# Patient Record
Sex: Male | Born: 1955 | Smoking: Former smoker
Health system: Southern US, Community
[De-identification: ages and names within clinical notes are randomized; demographics above are authoritative.]

## PROBLEM LIST (undated history)

## (undated) DIAGNOSIS — Z789 Other specified health status: Secondary | ICD-10-CM

## (undated) DIAGNOSIS — G454 Transient global amnesia: Secondary | ICD-10-CM

## (undated) DIAGNOSIS — K429 Umbilical hernia without obstruction or gangrene: Secondary | ICD-10-CM

## (undated) DIAGNOSIS — K5790 Diverticulosis of intestine, part unspecified, without perforation or abscess without bleeding: Secondary | ICD-10-CM

## (undated) DIAGNOSIS — D126 Benign neoplasm of colon, unspecified: Secondary | ICD-10-CM

## (undated) DIAGNOSIS — I10 Essential (primary) hypertension: Secondary | ICD-10-CM

## (undated) DIAGNOSIS — R7303 Prediabetes: Secondary | ICD-10-CM

## (undated) DIAGNOSIS — E785 Hyperlipidemia, unspecified: Secondary | ICD-10-CM

## (undated) DIAGNOSIS — Z87891 Personal history of nicotine dependence: Secondary | ICD-10-CM

## (undated) HISTORY — PX: NO PAST SURGERIES: SHX2092

---

## 2015-05-26 ENCOUNTER — Encounter: Payer: Self-pay | Admitting: *Deleted

## 2015-05-29 ENCOUNTER — Ambulatory Visit: Payer: BLUE CROSS/BLUE SHIELD | Admitting: *Deleted

## 2015-05-29 ENCOUNTER — Encounter
Admission: RE | Disposition: A | Discharge status: Home / Self Care | Payer: Self-pay | Source: Ambulatory Visit | Attending: Gastroenterology

## 2015-05-29 ENCOUNTER — Ambulatory Visit
Admission: RE | Admit: 2015-05-29 | Discharge: 2015-05-29 | Disposition: A | Discharge status: Home / Self Care | Payer: BLUE CROSS/BLUE SHIELD | Source: Ambulatory Visit | Attending: Gastroenterology | Admitting: Gastroenterology

## 2015-05-29 ENCOUNTER — Encounter: Payer: Self-pay | Admitting: *Deleted

## 2015-05-29 DIAGNOSIS — Z1211 Encounter for screening for malignant neoplasm of colon: Secondary | ICD-10-CM | POA: Diagnosis present

## 2015-05-29 DIAGNOSIS — D120 Benign neoplasm of cecum: Secondary | ICD-10-CM | POA: Diagnosis not present

## 2015-05-29 DIAGNOSIS — K5730 Diverticulosis of large intestine without perforation or abscess without bleeding: Secondary | ICD-10-CM | POA: Insufficient documentation

## 2015-05-29 HISTORY — PX: COLONOSCOPY WITH PROPOFOL: SHX5780

## 2015-05-29 HISTORY — DX: Other specified health status: Z78.9

## 2015-05-29 SURGERY — COLONOSCOPY WITH PROPOFOL
Anesthesia: General

## 2015-05-29 MED ORDER — PROPOFOL 500 MG/50ML IV EMUL
INTRAVENOUS | Status: DC | PRN
  Administered 2015-05-29: 140 ug/kg/min via INTRAVENOUS

## 2015-05-29 MED ORDER — FENTANYL CITRATE (PF) 100 MCG/2ML IJ SOLN
INTRAMUSCULAR | Status: DC | PRN
  Administered 2015-05-29: 100 ug via INTRAVENOUS

## 2015-05-29 MED ORDER — PROPOFOL 10 MG/ML IV BOLUS
INTRAVENOUS | Status: DC | PRN
  Administered 2015-05-29: 40 mg via INTRAVENOUS

## 2015-05-29 MED ORDER — MIDAZOLAM HCL 5 MG/5ML IJ SOLN
INTRAMUSCULAR | Status: DC | PRN
  Administered 2015-05-29 (×2): 1 mg via INTRAVENOUS

## 2015-05-29 MED ORDER — SODIUM CHLORIDE 0.9 % IV SOLN: INTRAVENOUS | Status: DC

## 2015-05-29 MED ORDER — SODIUM CHLORIDE 0.9 % IV SOLN
INTRAVENOUS | Status: DC
  Administered 2015-05-29: 14:00:00 via INTRAVENOUS

## 2015-05-29 NOTE — Anesthesia Procedure Notes (Signed)
Date/Time: 05/29/2015 3:03 PM Performed by: Kennon Holter Pre-anesthesia Checklist: Timeout performed, Patient being monitored, Suction available, Emergency Drugs available and Patient identified Patient Re-evaluated:Patient Re-evaluated prior to inductionOxygen Delivery Method: Nasal cannula Preoxygenation: Pre-oxygenation with 100% oxygen Intubation Type: IV induction Placement Confirmation: positive ETCO2

## 2015-05-29 NOTE — Transfer of Care (Signed)
Immediate Anesthesia Transfer of Care Note  Patient: Marco Carpenter  Procedure(s) Performed: Procedure(s): COLONOSCOPY WITH PROPOFOL (N/A)  Patient Location: PACU and Endoscopy Unit  Anesthesia Type:General  Level of Consciousness: awake, alert  and oriented  Airway & Oxygen Therapy: Patient Spontanous Breathing and Patient connected to nasal cannula oxygen  Post-op Assessment: Report given to RN and Post -op Vital signs reviewed and stable  Post vital signs: Reviewed and stable  Last Vitals:  Filed Vitals:   05/29/15 1419 05/29/15 1538  BP: 143/97   Pulse: 63   Temp: 36.6 C 35.7 C  Resp: 14     Last Pain: There were no vitals filed for this visit.       Complications: No apparent anesthesia complications

## 2015-05-29 NOTE — Anesthesia Preprocedure Evaluation (Addendum)
Anesthesia Evaluation  Patient identified by MRN, date of birth, ID band Patient awake    Reviewed: Allergy & Precautions, NPO status , Patient's Chart, lab work & pertinent test results  Airway Mallampati: II       Dental  (+) Teeth Intact   Pulmonary former smoker,           Cardiovascular Exercise Tolerance: Good negative cardio ROS   Rhythm:Regular     Neuro/Psych    GI/Hepatic negative GI ROS, Neg liver ROS,   Endo/Other  negative endocrine ROS  Renal/GU negative Renal ROS  negative genitourinary   Musculoskeletal   Abdominal   Peds negative pediatric ROS (+)  Hematology negative hematology ROS (+)   Anesthesia Other Findings   Reproductive/Obstetrics                            Anesthesia Physical Anesthesia Plan  ASA: II  Anesthesia Plan: General   Post-op Pain Management:    Induction: Intravenous  Airway Management Planned: Natural Airway  Additional Equipment:   Intra-op Plan:   Post-operative Plan:   Informed Consent: I have reviewed the patients History and Physical, chart, labs and discussed the procedure including the risks, benefits and alternatives for the proposed anesthesia with the patient or authorized representative who has indicated his/her understanding and acceptance.     Plan Discussed with: CRNA  Anesthesia Plan Comments:         Anesthesia Quick Evaluation

## 2015-05-29 NOTE — H&P (Signed)
Outpatient short stay form Pre-procedure 05/29/2015 2:43 PM Lollie Sails MD  Primary Physician: Dr Dion Body  Reason for visit:  Screening colonoscopy  History of present illness:  Patient is a 60 year old male presenting today as above. Tolerated his prep well. He takes no aspirin or blood thinning products.    Current facility-administered medications:  .  0.9 %  sodium chloride infusion, , Intravenous, Continuous, Lollie Sails, MD, Last Rate: 20 mL/hr at 05/29/15 1428 .  0.9 %  sodium chloride infusion, , Intravenous, Continuous, Lollie Sails, MD  No prescriptions prior to admission     No Known Allergies   Past Medical History  Diagnosis Date  . Medical history non-contributory     Review of systems:      Physical Exam    Heart and lungs: Regular rate and rhythm without rub or gallop, lungs are bilaterally clear.    HEENT: Normocephalic atraumatic eyes are anicteric    Other:     Pertinant exam for procedure: Soft nontender nondistended bowel sounds positive normoactive.    Planned proceedures: Colonoscopy and indicated procedures. I have discussed the risks benefits and complications of procedures to include not limited to bleeding, infection, perforation and the risk of sedation and the patient wishes to proceed.    Lollie Sails, MD Gastroenterology 05/29/2015  2:43 PM

## 2015-05-29 NOTE — Op Note (Signed)
[facility] Gastroenterology Patient Name: Marco Carpenter Procedure Date: 05/29/2015 2:59 PM MRN: [id number] Account #: [id number] Date of Birth: [birth year] Admit Type: Outpatient Age: 60 Room: [facility] ENDO ROOM 3 Gender: Male Note Status: Finalized Procedure:            Colonoscopy Indications:          Screening for colorectal malignant neoplasm Providers:            Lollie Sails, MD Referring MD:         Dion Body (Referring MD) Medicines:            Monitored Anesthesia Care Complications:        No immediate complications. Procedure:            Pre-Anesthesia Assessment:                       - ASA Grade Assessment: II - A patient with mild                        systemic disease.                       After obtaining informed consent, the colonoscope was                        passed under direct vision. Throughout the procedure,                        the patient's blood pressure, pulse, and oxygen                        saturations were monitored continuously. The                        Colonoscope was introduced through the anus and                        advanced to the the cecum, identified by appendiceal                        orifice and ileocecal valve. The colonoscopy was                        performed without difficulty. The colonoscopy was                        performed with moderate difficulty due to significant                        looping and a tortuous colon. Successful completion of                        the procedure was aided by changing the patient to a                        supine position and using manual pressure. The patient                        tolerated the procedure well. The quality of the bowel  preparation was fair. Findings:      A few small-mouthed diverticula were found in the sigmoid colon and       descending colon.      A 2 mm polyp was found in the cecum. The polyp was sessile. The  polyp       was removed with a cold biopsy forceps. Resection and retrieval were       complete.      The digital rectal exam was normal. Impression:           - Diverticulosis in the sigmoid colon and in the                        descending colon.                       - One 2 mm polyp in the cecum, removed with a cold                        biopsy forceps. Resected and retrieved. Recommendation:       - Discharge patient to home.                       - Telephone GI clinic for pathology results in 1 week. Procedure Code(s):    --- Professional ---                       [phone], Colonoscopy, flexible; with biopsy, single or                        multiple Diagnosis Code(s):    --- Professional ---                       Z12.11, Encounter for screening for malignant neoplasm                        of colon                       D12.0, Benign neoplasm of cecum                       K57.30, Diverticulosis of large intestine without                        perforation or abscess without bleeding CPT copyright 2016 [employer]. All rights reserved. The codes documented in this report are preliminary and upon coder review may  be revised to meet current compliance requirements. Lollie Sails, MD 05/29/2015 3:35:44 PM This report has been signed electronically. Number of Addenda: 0 Note Initiated On: 05/29/2015 2:59 PM Scope Withdrawal Time: 0 hours 8 minutes 42 seconds  Total Procedure Duration: 0 hours 26 minutes 18 seconds       [facility]

## 2015-05-30 ENCOUNTER — Encounter: Payer: Self-pay | Admitting: Gastroenterology

## 2015-05-30 NOTE — Anesthesia Postprocedure Evaluation (Signed)
Anesthesia Post Note  Patient: Marco Carpenter  Procedure(s) Performed: Procedure(s) (LRB): COLONOSCOPY WITH PROPOFOL (N/A)  Patient location during evaluation: Endoscopy Anesthesia Type: General Level of consciousness: awake and alert Pain management: pain level controlled Vital Signs Assessment: post-procedure vital signs reviewed and stable Respiratory status: spontaneous breathing, nonlabored ventilation, respiratory function stable and patient connected to nasal cannula oxygen Cardiovascular status: blood pressure returned to baseline and stable Postop Assessment: no signs of nausea or vomiting Anesthetic complications: no    Last Vitals:  Filed Vitals:   05/29/15 1558 05/29/15 1608  BP: 106/81 121/83  Pulse: 57 51  Temp:    Resp: 16 17    Last Pain: There were no vitals filed for this visit.               Martha Clan

## 2015-05-31 LAB — SURGICAL PATHOLOGY

## 2020-01-29 HISTORY — PX: NASAL SINUS SURGERY: SHX719

## 2020-03-09 ENCOUNTER — Other Ambulatory Visit: Payer: Self-pay | Admitting: Family Medicine

## 2020-03-09 DIAGNOSIS — Z87828 Personal history of other (healed) physical injury and trauma: Secondary | ICD-10-CM

## 2020-03-29 ENCOUNTER — Ambulatory Visit
Admission: RE | Admit: 2020-03-29 | Discharge: 2020-03-29 | Disposition: A | Discharge status: Home / Self Care | Payer: Medicare Other | Source: Ambulatory Visit | Attending: Family Medicine | Admitting: Family Medicine

## 2020-03-29 ENCOUNTER — Other Ambulatory Visit: Payer: Self-pay

## 2020-03-29 DIAGNOSIS — Z87828 Personal history of other (healed) physical injury and trauma: Secondary | ICD-10-CM

## 2020-03-29 DIAGNOSIS — W19XXXA Unspecified fall, initial encounter: Secondary | ICD-10-CM | POA: Diagnosis not present

## 2020-03-29 DIAGNOSIS — R519 Headache, unspecified: Secondary | ICD-10-CM | POA: Insufficient documentation

## 2020-03-29 DIAGNOSIS — Z87820 Personal history of traumatic brain injury: Secondary | ICD-10-CM | POA: Insufficient documentation

## 2020-03-29 IMAGING — CT CT HEAD W/O CM
1 series · 15 of 30 positions shown, 19 images · non-contrast
Comparison: No pertinent prior exams available for comparison.

CLINICAL DATA: History of traumatic head injury. Additional history
provided: Fall [DATE] hitting head in left frontal area,
headache since that time.

EXAM:
CT HEAD WITHOUT CONTRAST
TECHNIQUE: Contiguous axial images were obtained from the base of the skull
through the vertex without intravenous contrast.

[Series 2: head wo · axial · 0.43mm/px · z∈[-116,+24]mm · 15 of 32 slices shown, 19 images]
[im 2/32  brain]
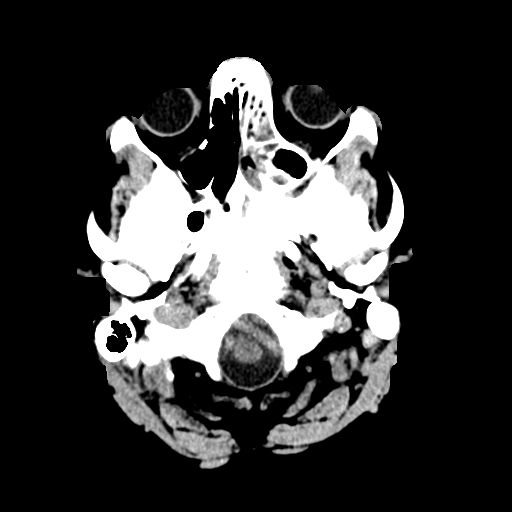
[im 2/32  bone]
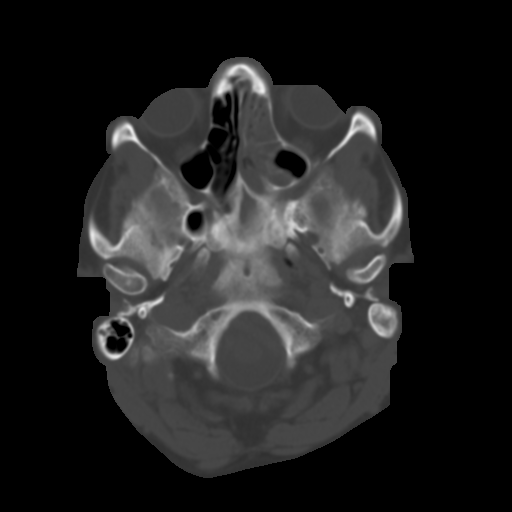
[im 4/32  brain]
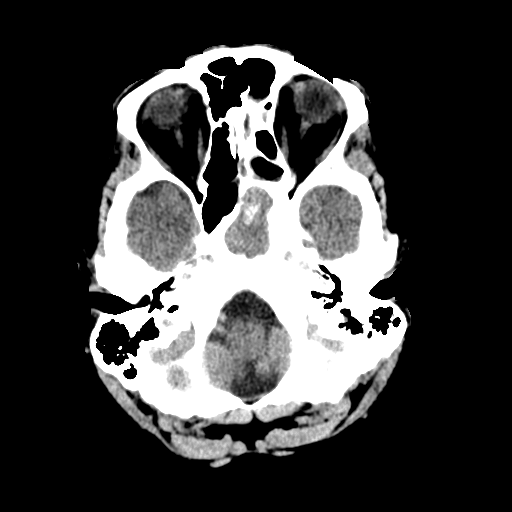
[im 6/32  brain]
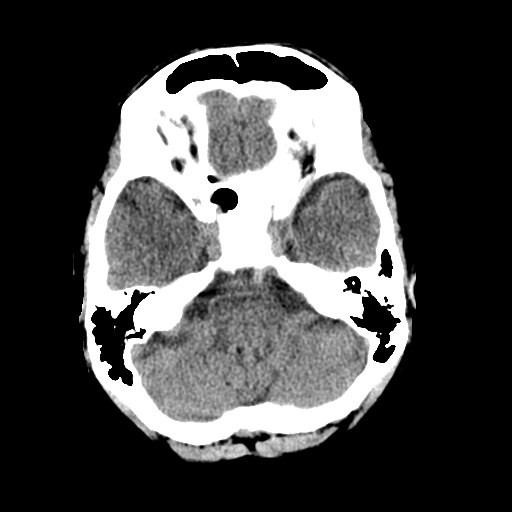
[im 8/32  brain]
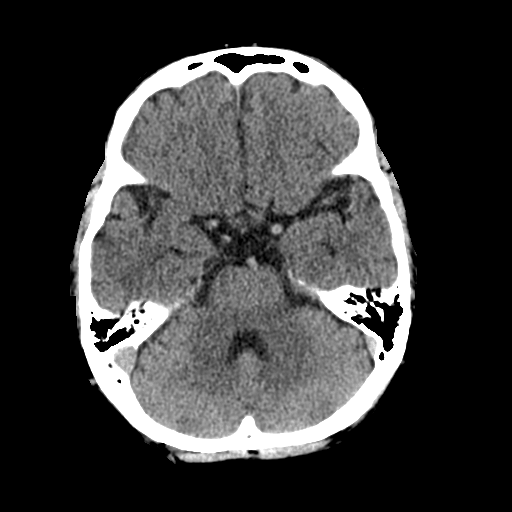
[im 10/32  brain]
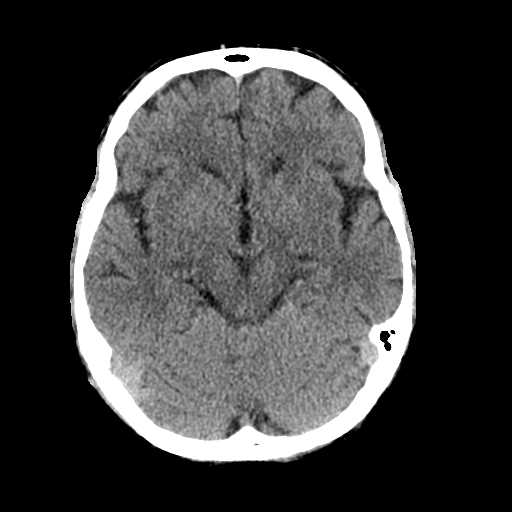
[im 10/32  bone]
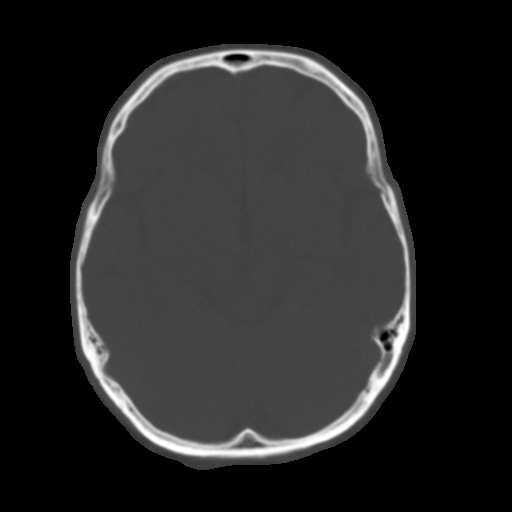
[im 12/32  brain]
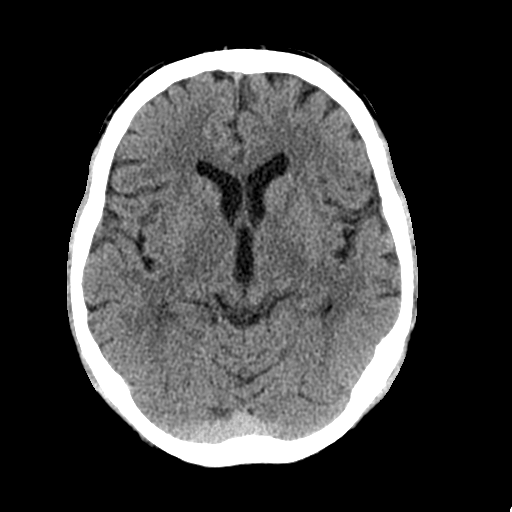
[im 14/32  brain]
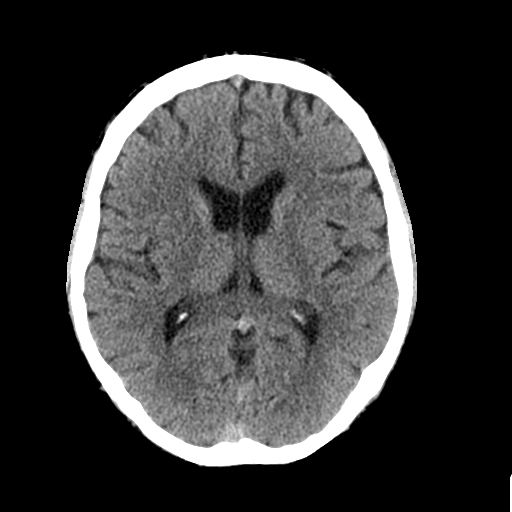
[im 17/32  brain]
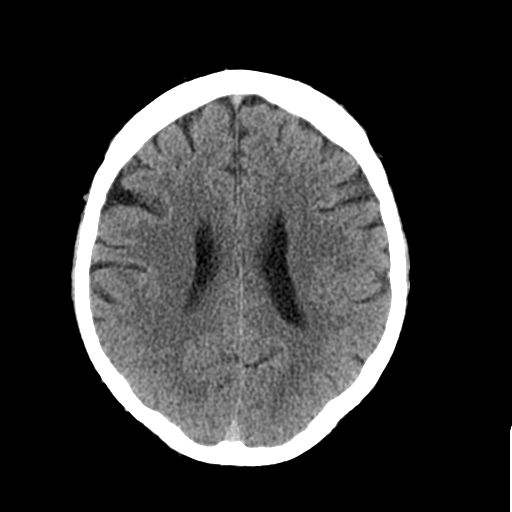
[im 18/32  brain]
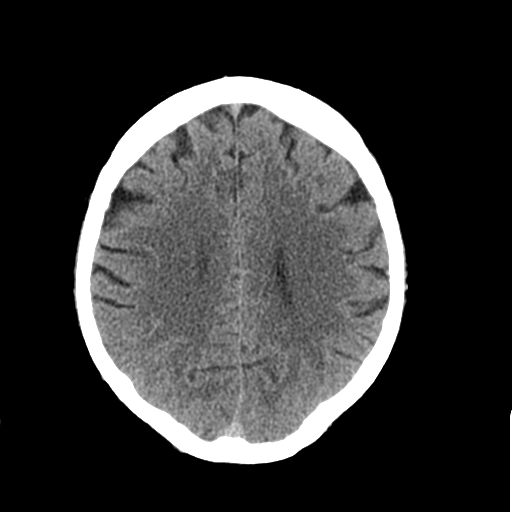
[im 18/32  bone]
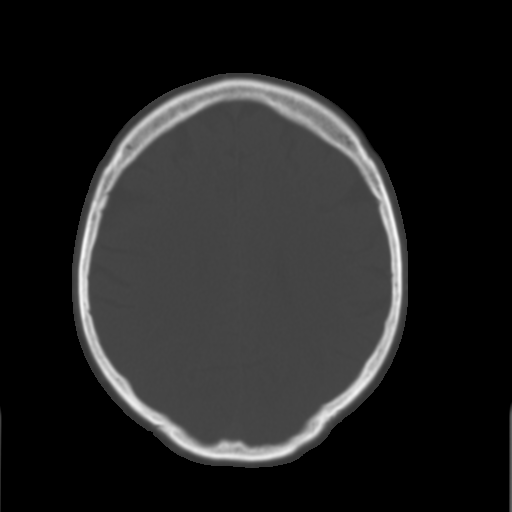
[im 20/32  brain]
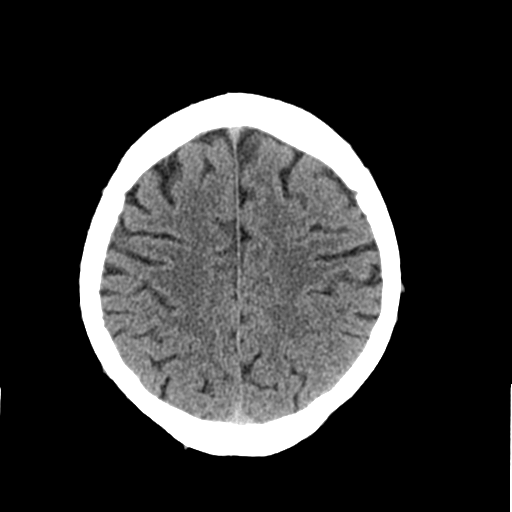
[im 22/32  brain]
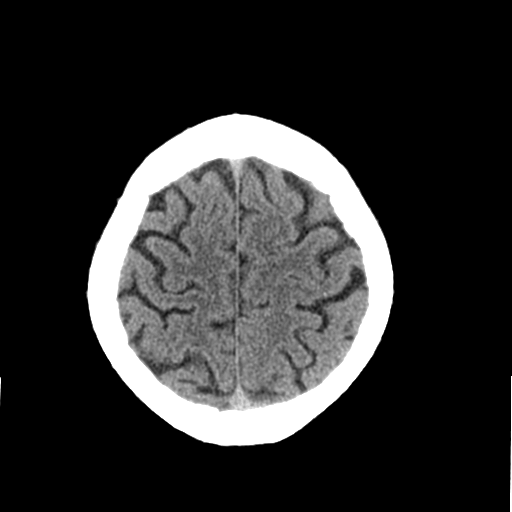
[im 24/32  brain]
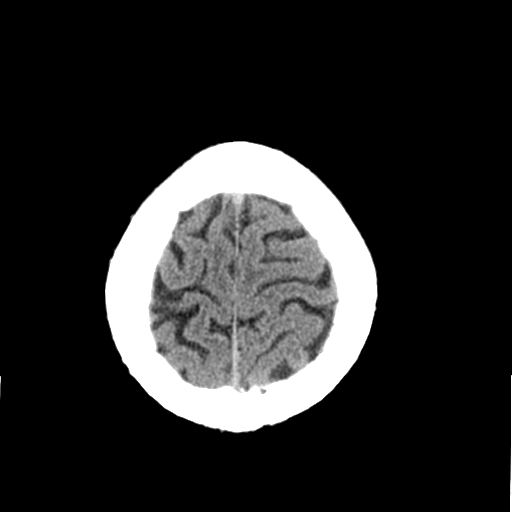
[im 26/32  brain]
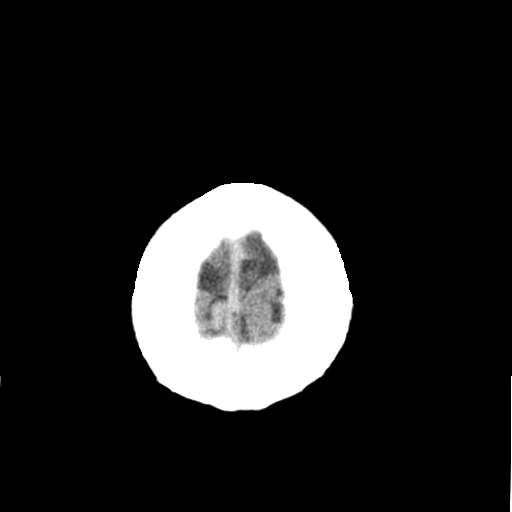
[im 26/32  bone]
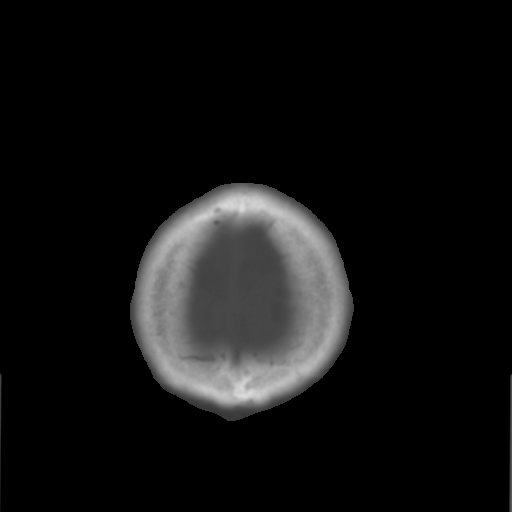
[im 28/32  brain]
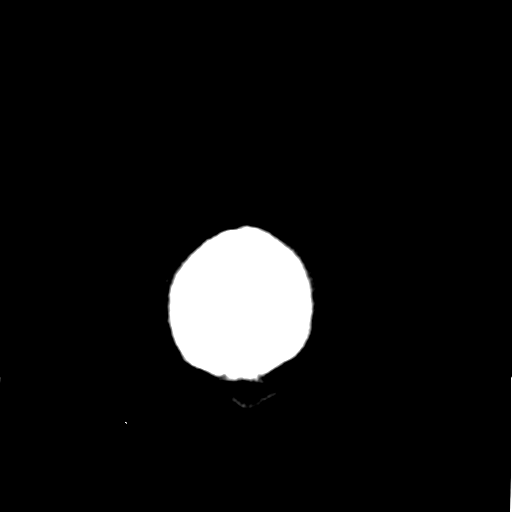
[im 30/32  brain]
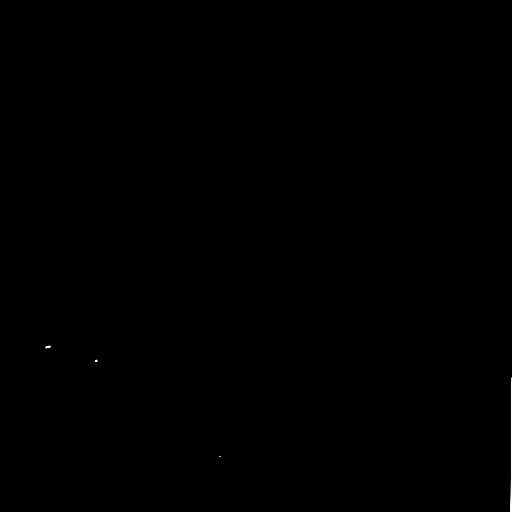

[15 of 30 positions shown; findings below may reference images not displayed]

FINDINGS: Brain:

Mild cerebral atrophy.

There is no acute intracranial hemorrhage.

No demarcated cortical infarct.

No extra-axial fluid collection.

No evidence of intracranial mass.

No midline shift.

Vascular: No hyperdense vessel.

Skull: Normal. Negative for fracture or focal lesion.

Sinuses/Orbits: Visualized orbits show no acute finding. Complete
opacification of the left sphenoid sinus with associated chronic
reactive osteitis. Additionally, the left sphenoid sinus ostium
appears expanded. Mild right sphenoid sinus mucosal thickening.
Extensive partial opacification of the left ethmoid air cells. Mild
left frontal sinus mucosal thickening. Mild-to-moderate mucosal
thickening within the partially imaged left maxillary sinus.
IMPRESSION: No evidence of acute intracranial abnormality.

Mild cerebral atrophy.

Severe chronic left sphenoid sinusitis with complete opacification
of the sinus and expansion of the left sphenoid sinus ostium.
Additional disease within the left frontal, bilateral ethmoid, right
sphenoid and left maxillary sinuses as described. Consider ENT
consultation.

## 2020-05-06 ENCOUNTER — Inpatient Hospital Stay
Admission: EM | Admit: 2020-05-06 | Discharge: 2020-05-09 | DRG: 072 | Disposition: A | Discharge status: Home / Self Care | Payer: Medicare Other | Attending: Internal Medicine | Admitting: Internal Medicine

## 2020-05-06 ENCOUNTER — Emergency Department: Payer: Medicare Other

## 2020-05-06 ENCOUNTER — Other Ambulatory Visit: Payer: Self-pay

## 2020-05-06 ENCOUNTER — Inpatient Hospital Stay: Payer: Medicare Other

## 2020-05-06 ENCOUNTER — Encounter: Payer: Self-pay | Admitting: Internal Medicine

## 2020-05-06 DIAGNOSIS — D72829 Elevated white blood cell count, unspecified: Secondary | ICD-10-CM | POA: Diagnosis present

## 2020-05-06 DIAGNOSIS — Z20822 Contact with and (suspected) exposure to covid-19: Secondary | ICD-10-CM | POA: Diagnosis present

## 2020-05-06 DIAGNOSIS — G44209 Tension-type headache, unspecified, not intractable: Secondary | ICD-10-CM | POA: Diagnosis present

## 2020-05-06 DIAGNOSIS — G9341 Metabolic encephalopathy: Principal | ICD-10-CM | POA: Diagnosis present

## 2020-05-06 DIAGNOSIS — I10 Essential (primary) hypertension: Secondary | ICD-10-CM | POA: Diagnosis present

## 2020-05-06 DIAGNOSIS — R030 Elevated blood-pressure reading, without diagnosis of hypertension: Secondary | ICD-10-CM | POA: Diagnosis present

## 2020-05-06 DIAGNOSIS — R079 Chest pain, unspecified: Secondary | ICD-10-CM | POA: Diagnosis present

## 2020-05-06 DIAGNOSIS — R519 Headache, unspecified: Secondary | ICD-10-CM | POA: Diagnosis present

## 2020-05-06 DIAGNOSIS — R778 Other specified abnormalities of plasma proteins: Secondary | ICD-10-CM | POA: Diagnosis present

## 2020-05-06 DIAGNOSIS — T380X5A Adverse effect of glucocorticoids and synthetic analogues, initial encounter: Secondary | ICD-10-CM | POA: Diagnosis present

## 2020-05-06 DIAGNOSIS — R7989 Other specified abnormal findings of blood chemistry: Secondary | ICD-10-CM | POA: Diagnosis present

## 2020-05-06 DIAGNOSIS — Z87891 Personal history of nicotine dependence: Secondary | ICD-10-CM | POA: Diagnosis not present

## 2020-05-06 DIAGNOSIS — J323 Chronic sphenoidal sinusitis: Secondary | ICD-10-CM | POA: Diagnosis present

## 2020-05-06 DIAGNOSIS — R413 Other amnesia: Secondary | ICD-10-CM | POA: Diagnosis present

## 2020-05-06 LAB — COMPREHENSIVE METABOLIC PANEL
ALT: 22 U/L (ref 0–44)
AST: 22 U/L (ref 15–41)
Albumin: 4.4 g/dL (ref 3.5–5.0)
Alkaline Phosphatase: 75 U/L (ref 38–126)
Anion gap: 10 (ref 5–15)
BUN: 19 mg/dL (ref 8–23)
CO2: 22 mmol/L (ref 22–32)
Calcium: 8.6 mg/dL — ABNORMAL LOW (ref 8.9–10.3)
Chloride: 104 mmol/L (ref 98–111)
Creatinine, Ser: 1.02 mg/dL (ref 0.61–1.24)
GFR, Estimated: >60 mL/min (ref >60)
Glucose, Bld: 132 mg/dL — ABNORMAL HIGH (ref 70–99)
Potassium: 3.5 mmol/L (ref 3.5–5.1)
Sodium: 136 mmol/L (ref 135–145)
Total Bilirubin: 0.9 mg/dL (ref 0.3–1.2)
Total Protein: 7.5 g/dL (ref 6.5–8.1)

## 2020-05-06 LAB — CBC WITH DIFFERENTIAL/PLATELET
Abs Immature Granulocytes: 0.29 10*3/uL — ABNORMAL HIGH (ref 0.00–0.07)
Basophils Absolute: 0.1 10*3/uL (ref 0.0–0.1)
Basophils Relative: 0 %
Eosinophils Absolute: 0 10*3/uL (ref 0.0–0.5)
Eosinophils Relative: 0 %
HCT: 49.4 % (ref 39.0–52.0)
Hemoglobin: 16.9 g/dL (ref 13.0–17.0)
Immature Granulocytes: 2 %
Lymphocytes Relative: 17 %
Lymphs Abs: 2.1 10*3/uL (ref 0.7–4.0)
MCH: 30.5 pg (ref 26.0–34.0)
MCHC: 34.2 g/dL (ref 30.0–36.0)
MCV: 89.2 fL (ref 80.0–100.0)
Monocytes Absolute: 0.9 10*3/uL (ref 0.1–1.0)
Monocytes Relative: 7 %
Neutro Abs: 9.2 10*3/uL — ABNORMAL HIGH (ref 1.7–7.7)
Neutrophils Relative %: 74 %
Platelets: 345 10*3/uL (ref 150–400)
RBC: 5.54 MIL/uL (ref 4.22–5.81)
RDW: 12.9 % (ref 11.5–15.5)
WBC: 12.5 10*3/uL — ABNORMAL HIGH (ref 4.0–10.5)
nRBC: 0 % (ref 0.0–0.2)

## 2020-05-06 LAB — URINALYSIS, COMPLETE (UACMP) WITH MICROSCOPIC
Bacteria, UA: NONE SEEN
Bilirubin Urine: NEGATIVE
Glucose, UA: NEGATIVE mg/dL
Ketones, ur: 5 mg/dL — AB
Leukocytes,Ua: NEGATIVE
Nitrite: NEGATIVE
Protein, ur: NEGATIVE mg/dL
Specific Gravity, Urine: 1.019 (ref 1.005–1.030)
pH: 6 (ref 5.0–8.0)

## 2020-05-06 LAB — TROPONIN I (HIGH SENSITIVITY)
Troponin I (High Sensitivity): 25 ng/L — ABNORMAL HIGH (ref <18)
Troponin I (High Sensitivity): 144 ng/L (ref <18)
Troponin I (High Sensitivity): 176 ng/L (ref <18)
Troponin I (High Sensitivity): 239 ng/L (ref <18)

## 2020-05-06 LAB — URINE DRUG SCREEN, QUALITATIVE (ARMC ONLY)
Amphetamines, Ur Screen: NOT DETECTED
Barbiturates, Ur Screen: NOT DETECTED
Benzodiazepine, Ur Scrn: NOT DETECTED
Cannabinoid 50 Ng, Ur ~~LOC~~: NOT DETECTED
Cocaine Metabolite,Ur ~~LOC~~: NOT DETECTED
MDMA (Ecstasy)Ur Screen: NOT DETECTED
Methadone Scn, Ur: NOT DETECTED
Opiate, Ur Screen: POSITIVE — AB
Phencyclidine (PCP) Ur S: NOT DETECTED
Tricyclic, Ur Screen: NOT DETECTED

## 2020-05-06 LAB — SEDIMENTATION RATE: Sed Rate: 1 mm/hr (ref 0–20)

## 2020-05-06 LAB — D-DIMER, QUANTITATIVE: D-Dimer, Quant: 0.48 ug/mL-FEU (ref 0.00–0.50)

## 2020-05-06 LAB — RESP PANEL BY RT-PCR (FLU A&B, COVID) ARPGX2
Influenza A by PCR: NEGATIVE
Influenza B by PCR: NEGATIVE
SARS Coronavirus 2 by RT PCR: NEGATIVE

## 2020-05-06 IMAGING — MR MR HEAD WO/W CM
13 series · 48 of 48 positions shown · IV contrast (gadavist)
Comparison: None.

CLINICAL DATA: Headache and encephalopathy

EXAM:
MRI HEAD WITHOUT AND WITH CONTRAST
MRV HEAD WITHOUT AND WITH CONTRAST
TECHNIQUE: Multiplanar, multiecho pulse sequences of the brain and surrounding
structures were obtained without and with intravenous contrast.
Angiographic images of the intracranial venous structures were
obtained using MRV technique without and with intravenous contrast.
CONTRAST:  8mL GADAVIST GADOBUTROL 1 MMOL/ML IV SOLN

[Series 5: ax dwi_tracew · axial · 3.0mm · 0.71mm/px · z∈[-67,+96]mm · 4 of 56 slices shown]
[im 1/56]
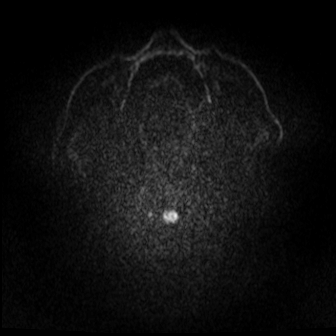
[im 19/56]
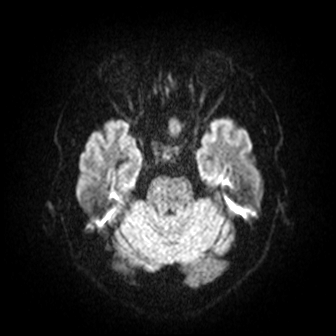
[im 37/56]
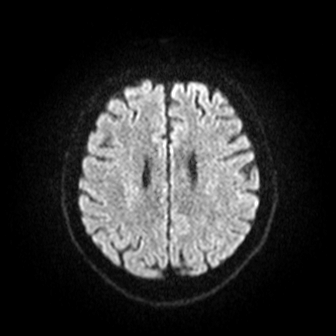
[im 56/56]
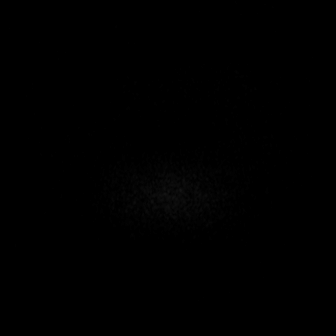

[Series 6: ax dwi_adc · axial · 3.0mm · 0.71mm/px · z∈[-67,+93]mm · 4 of 55 slices shown]
[im 1/55]
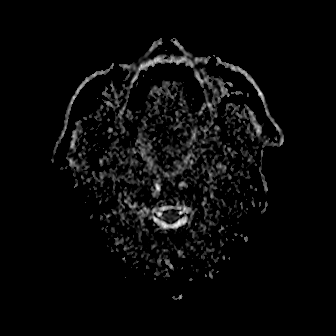
[im 19/55]
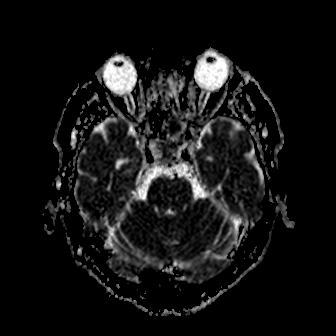
[im 37/55]
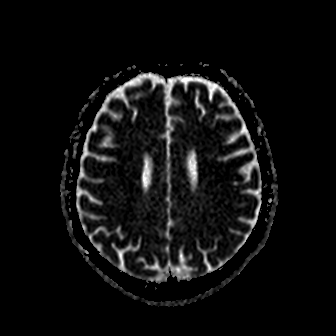
[im 55/55]
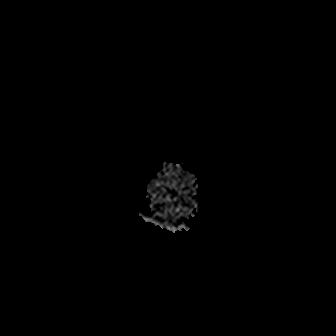

[Series 7: cor dwi_tracew · coronal · 5.0mm · 0.68mm/px · 2 of 40 slices shown]
[im 1/40]
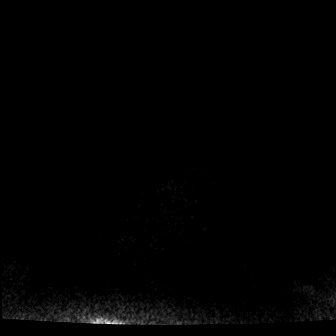
[im 40/40]
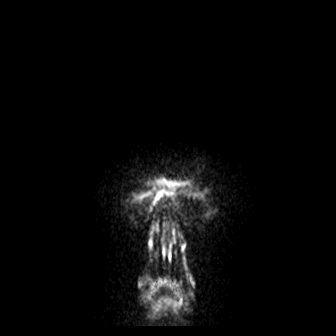

[Series 8: cor dwi_adc · coronal · 5.0mm · 0.68mm/px · 2 of 40 slices shown]
[im 1/40]
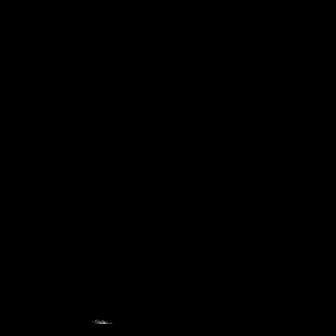
[im 40/40]
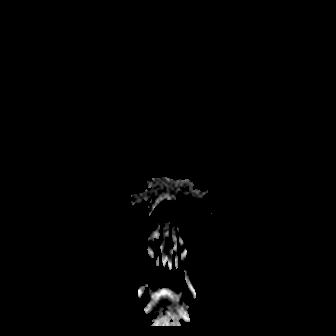

[Series 9: T1 · sagittal · 5.0mm · 0.47mm/px · 1 of 24 slices shown (1 of 3)]
[im 1/24]
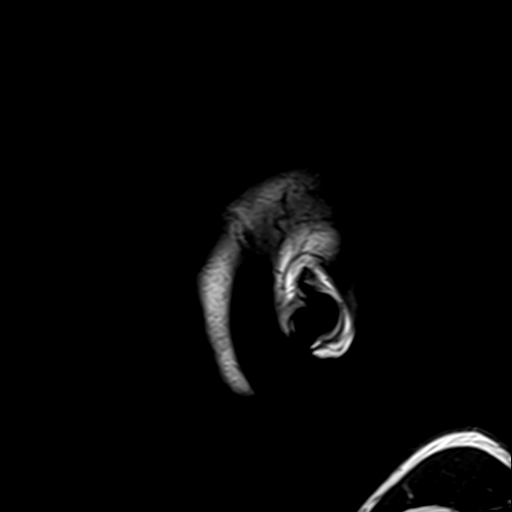

[Series 10: T2 · axial · 5.0mm · 0.86mm/px · z∈[-61,+94]mm · 2 of 27 slices shown (1 of 2)]
[im 1/27]
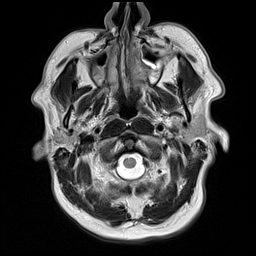
[im 27/27]
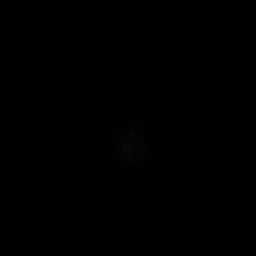

[Series 12: pha_images · axial · 3.0mm · 0.90mm/px · z∈[-58,+93]mm · 3 of 52 slices shown]
[im 1/52]
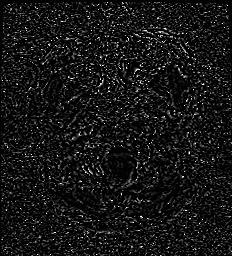
[im 26/52]
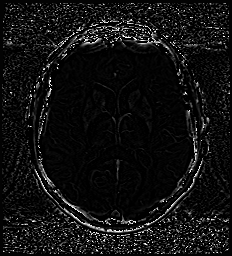
[im 52/52]
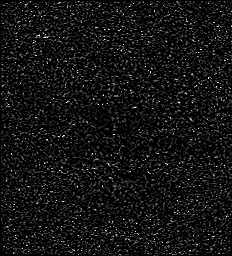

[Series 13: swi_images · axial · 3.0mm · 0.90mm/px · z∈[-58,+93]mm · 3 of 52 slices shown]
[im 1/52]
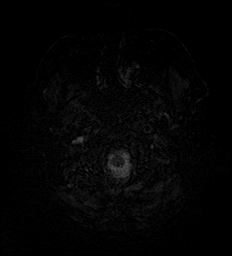
[im 26/52]
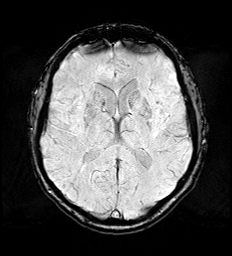
[im 52/52]
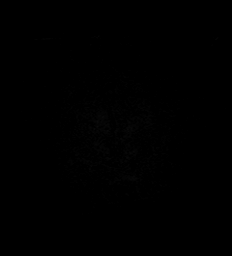

[Series 15: FLAIR · axial · 3.0mm · 0.69mm/px · z∈[-64,+94]mm · 3 of 54 slices shown]
[im 1/54]
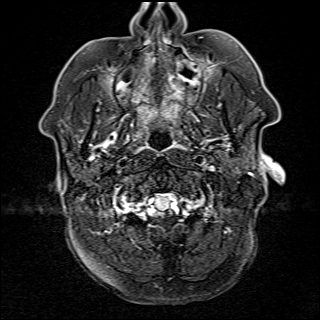
[im 27/54]
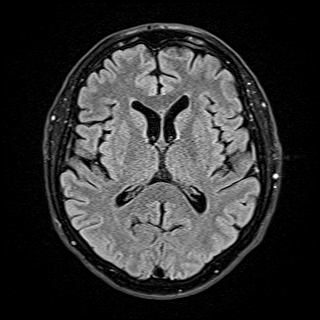
[im 54/54]
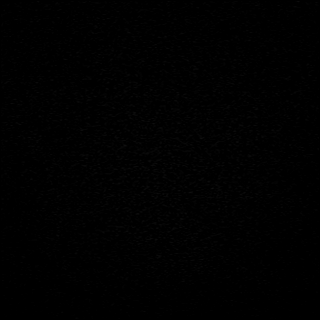

[Series 16: T1 · sagittal · 1.0mm · 0.98mm/px · 10 of 176 slices shown (2 of 3)]
[im 1/176]
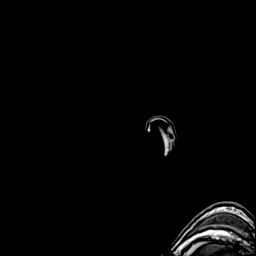
[im 20/176]
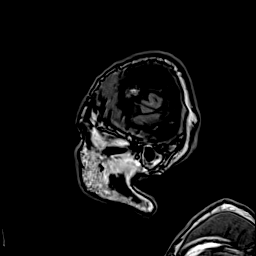
[im 39/176]
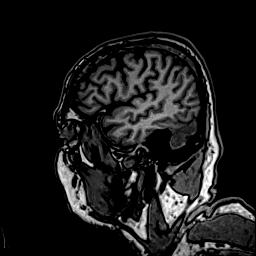
[im 59/176]
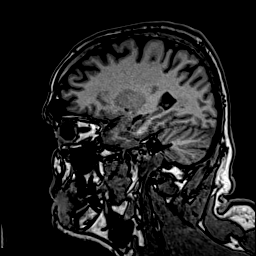
[im 78/176]
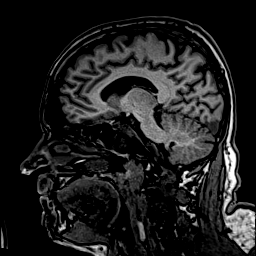
[im 98/176]
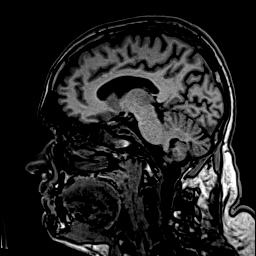
[im 117/176]
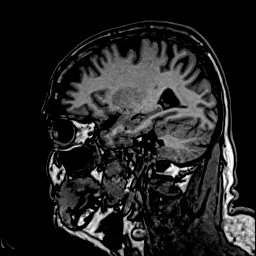
[im 137/176]
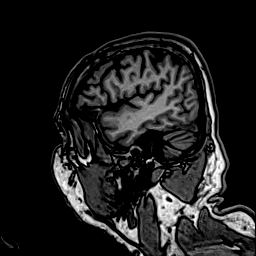
[im 156/176]
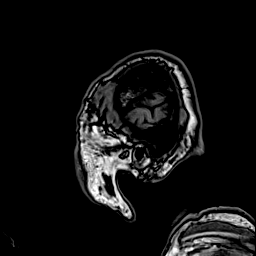
[im 176/176]
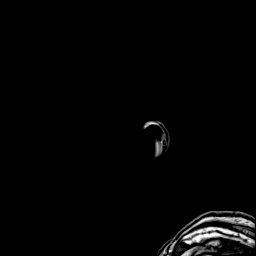

[Series 17: T2 · coronal · 5.0mm · 0.86mm/px · 2 of 30 slices shown (2 of 2)]
[im 1/30]
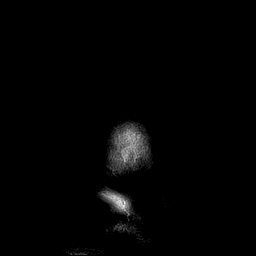
[im 30/30]
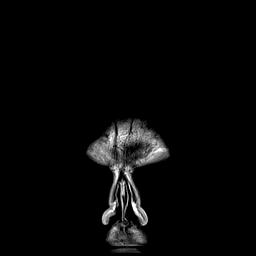

[Series 18: T1 · sagittal · 1.0mm · 0.98mm/px · 10 of 176 slices shown (3 of 3)]
[im 1/176]
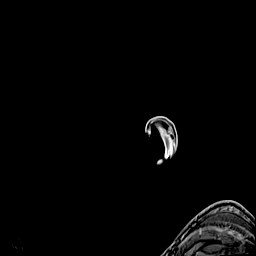
[im 20/176]
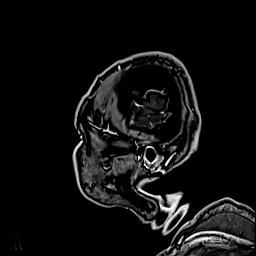
[im 39/176]
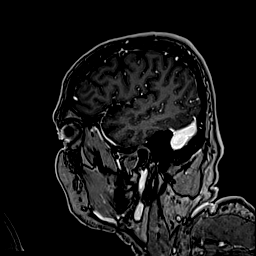
[im 59/176]
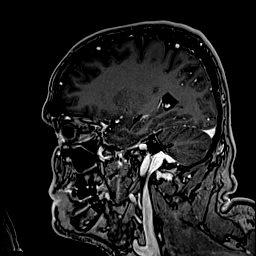
[im 78/176]
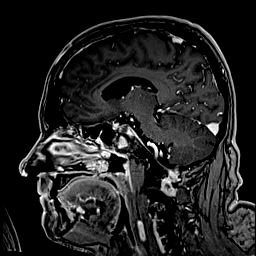
[im 98/176]
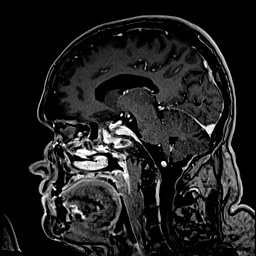
[im 117/176]
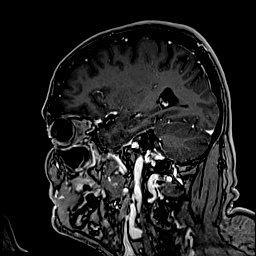
[im 137/176]
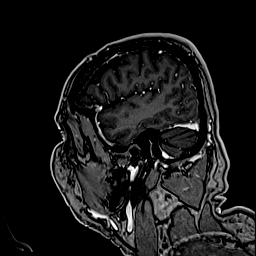
[im 156/176]
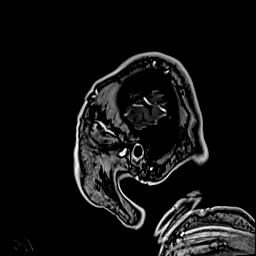
[im 176/176]
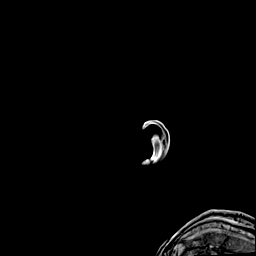

[Series 19: T1 post-contrast · coronal · 5.0mm · 0.43mm/px · 2 of 30 slices shown]
[im 1/30]
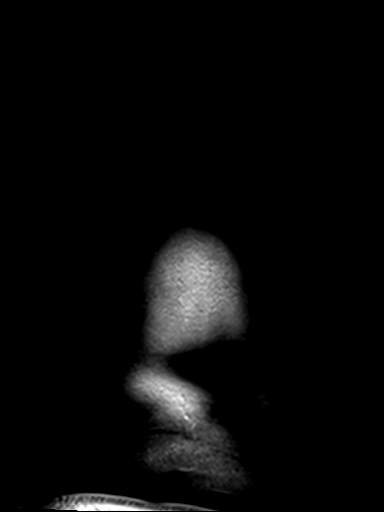
[im 30/30]
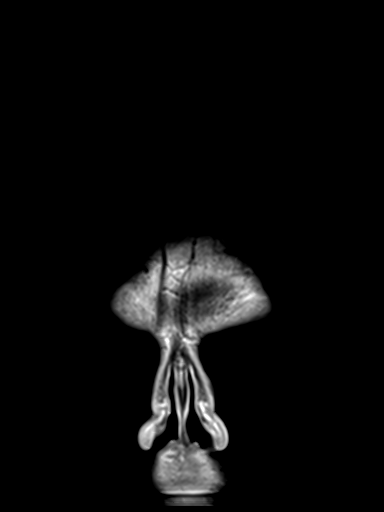

[48 of 48 positions shown; findings below may reference images not displayed]

FINDINGS: MRI BRAIN FINDINGS

Brain: No acute infarct, mass effect or extra-axial collection. No
acute or chronic hemorrhage. Normal white matter signal, parenchymal
volume and CSF spaces. The midline structures are normal.

Vascular: Major flow voids are preserved.

Skull and upper cervical spine: Normal calvarium and skull base.
Visualized upper cervical spine and soft tissues are normal.

Sinuses/Orbits:Sphenoid sinus mucosal thickening and opacification.
Normal orbits.

MRV BRAIN FINDINGS

Superior sagittal sinus: Normal.

Straight sinus: Normal.

Inferior sagittal sinus, vein of TEODORIO and internal cerebral veins:
Normal.

Transverse sinuses: Normal.

Sigmoid sinuses: Normal.

Visualized jugular veins: Normal.
IMPRESSION: 1. No acute intracranial abnormality.
2. Normal intracranial MRV.

## 2020-05-06 IMAGING — DX DG CHEST 1V PORT
1 series · 1 of 1 positions shown · non-contrast
Comparison: None.

CLINICAL DATA: Hypoxia with chest pain.

EXAM:
PORTABLE CHEST 1 VIEW

[chest ap]
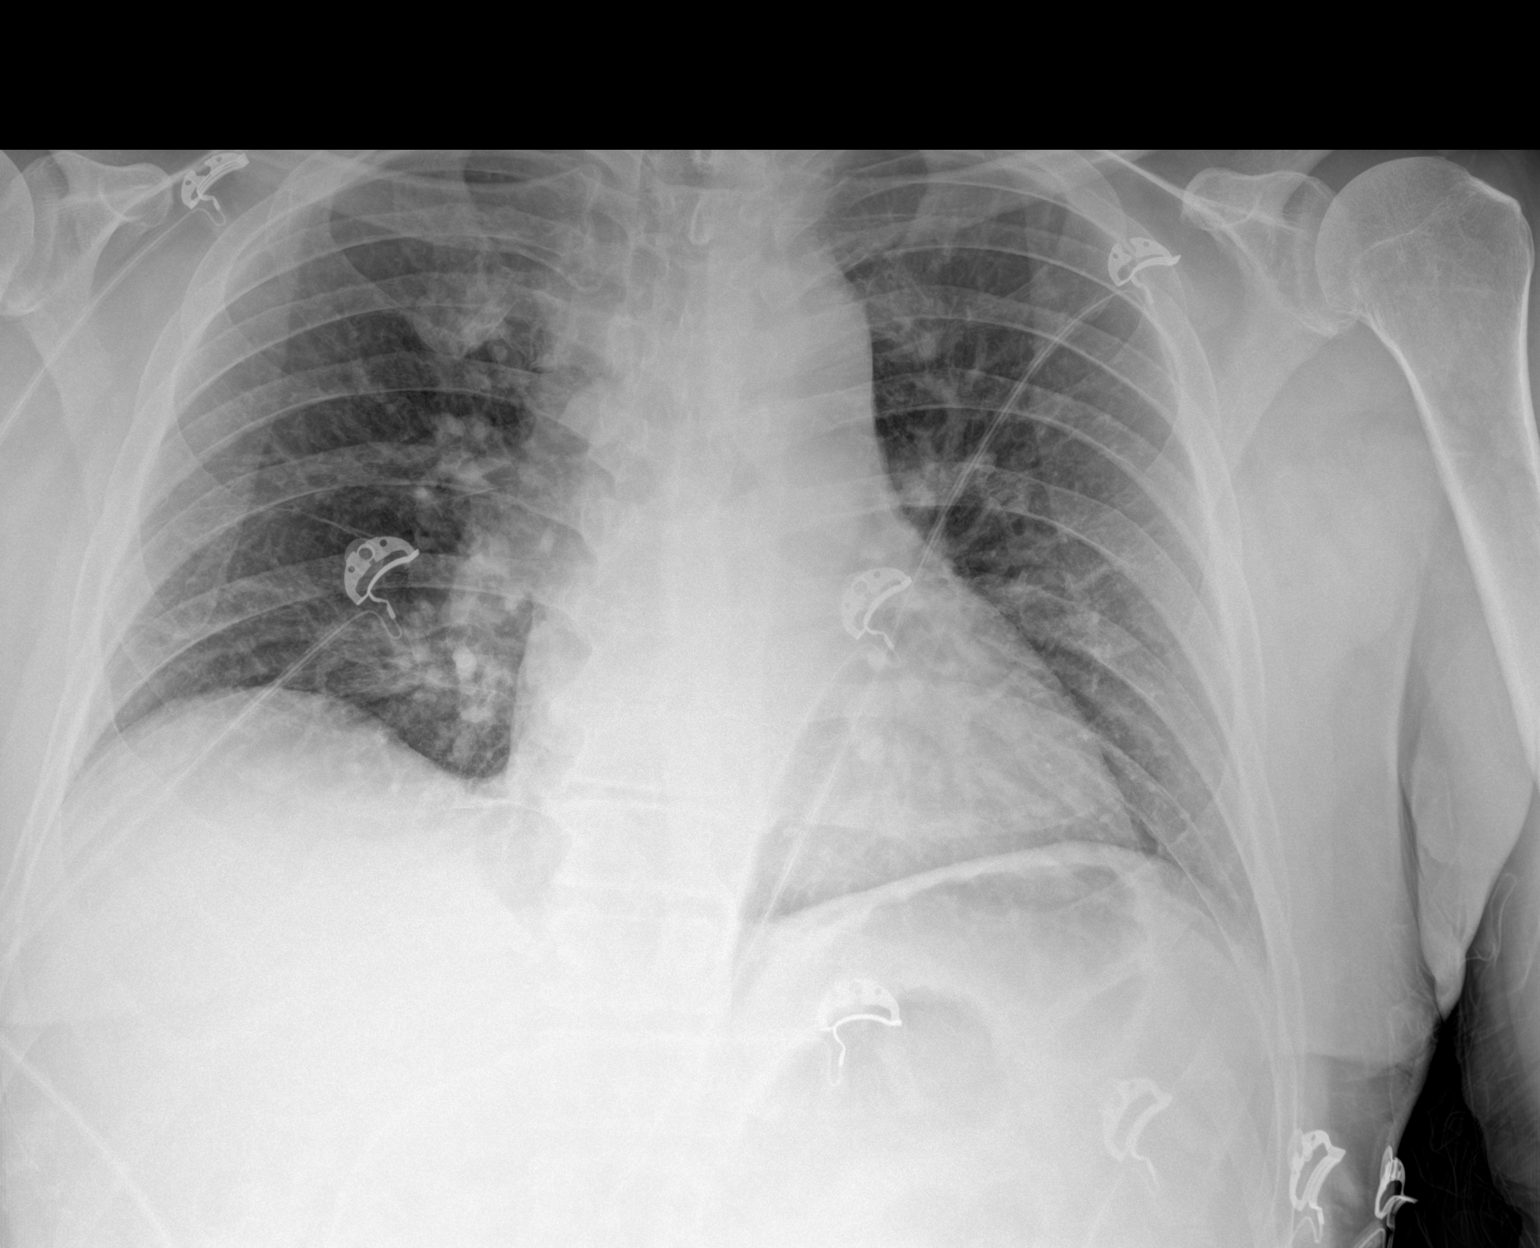

[1 of 1 positions shown; findings below may reference images not displayed]

FINDINGS: [8O] hours. Low volume film. Cardiopericardial silhouette is at
upper limits of normal for size. There is pulmonary vascular
congestion without overt pulmonary edema. The visualized bony
structures of the thorax show no acute abnormality. Telemetry leads
overlie the chest.
IMPRESSION: Low volume film with pulmonary vascular congestion.

## 2020-05-06 IMAGING — CT CT HEAD W/O CM
3 series · 15 of 47 positions shown, 18 images · non-contrast
Comparison: [DATE]

CLINICAL DATA: Altered mental status and headache

EXAM:
CT HEAD WITHOUT CONTRAST
TECHNIQUE: Contiguous axial images were obtained from the base of the skull
through the vertex without intravenous contrast.

[Series 3: head wo · axial · 0.41mm/px · z∈[-68,+62]mm · 9 of 32 slices shown, 12 images]
[im 3/32  brain]
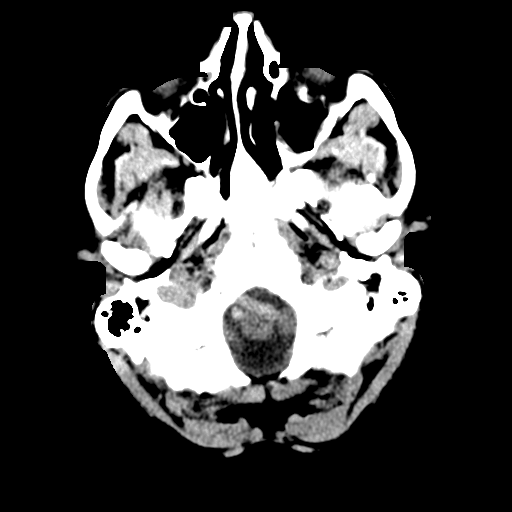
[im 3/32  bone]
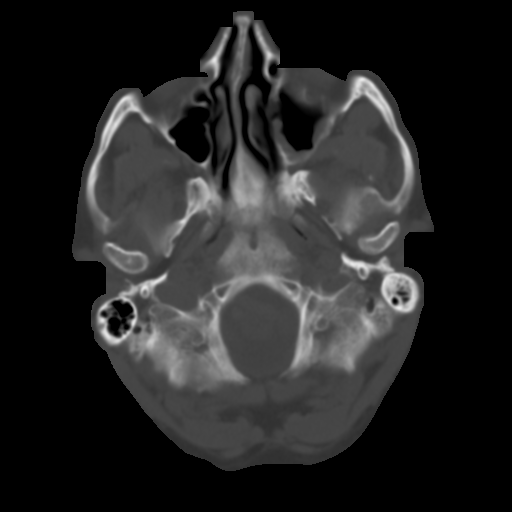
[im 6/32  brain]
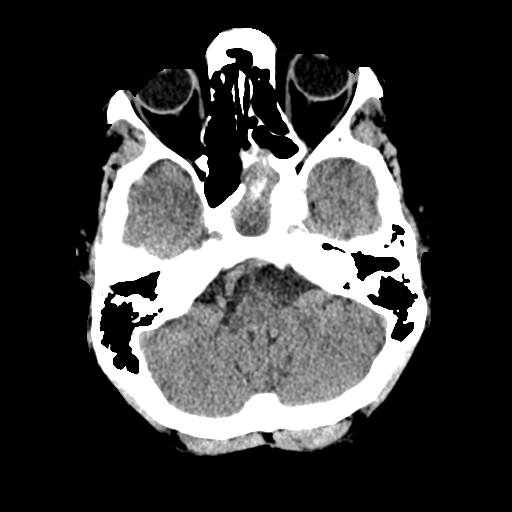
[im 9/32  brain]
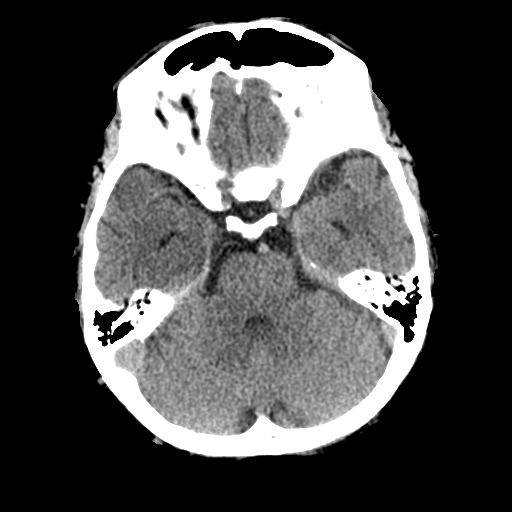
[im 12/32  brain]
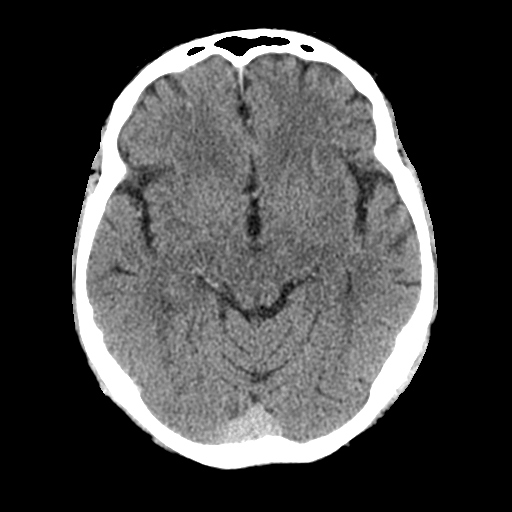
[im 17/32  brain]
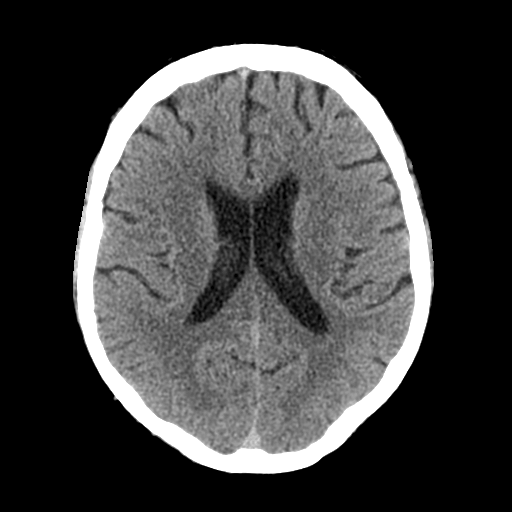
[im 17/32  bone]
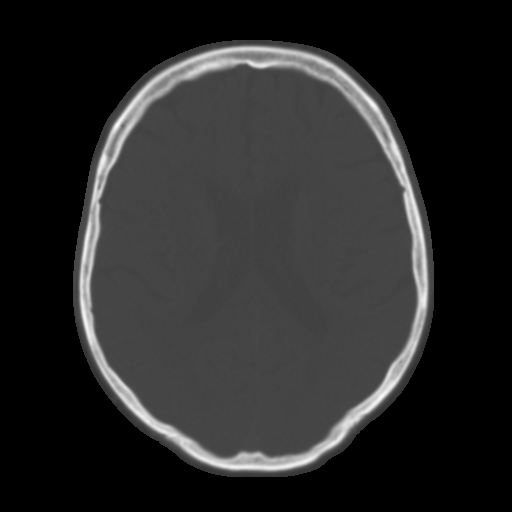
[im 20/32  brain]
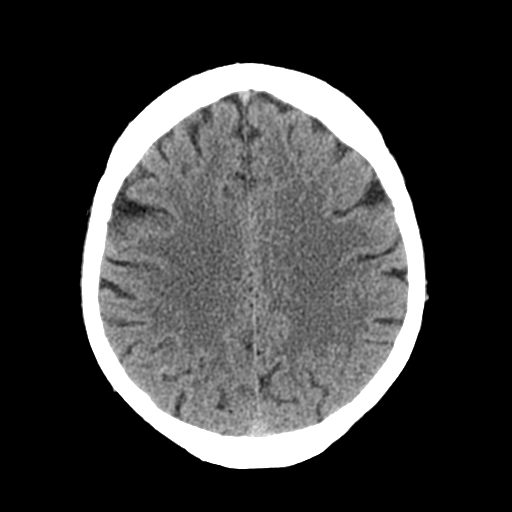
[im 23/32  brain]
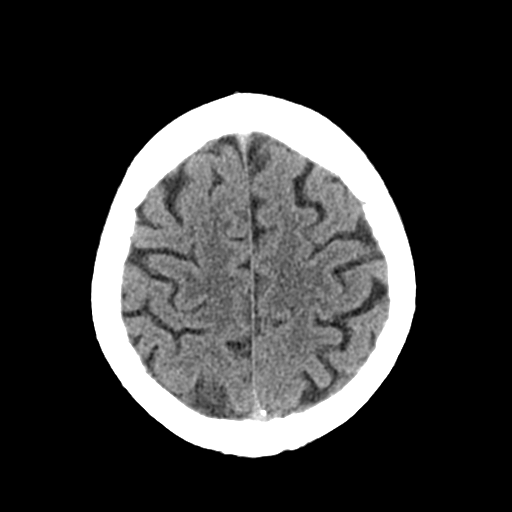
[im 26/32  brain]
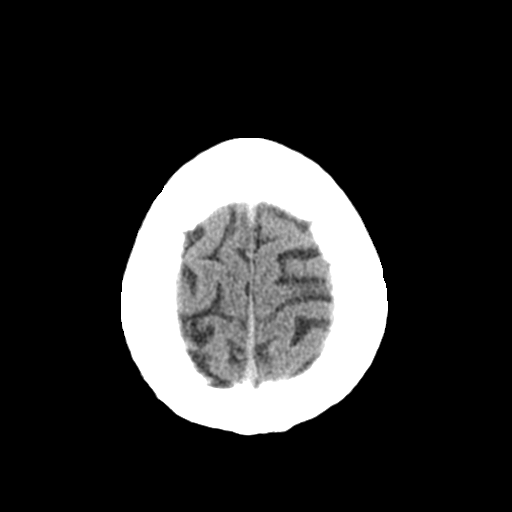
[im 29/32  brain]
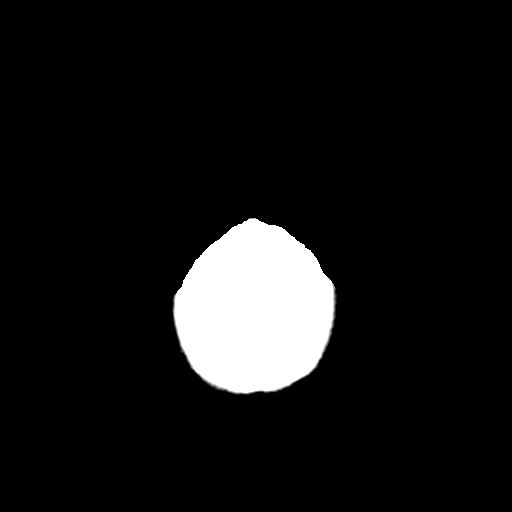
[im 29/32  bone]
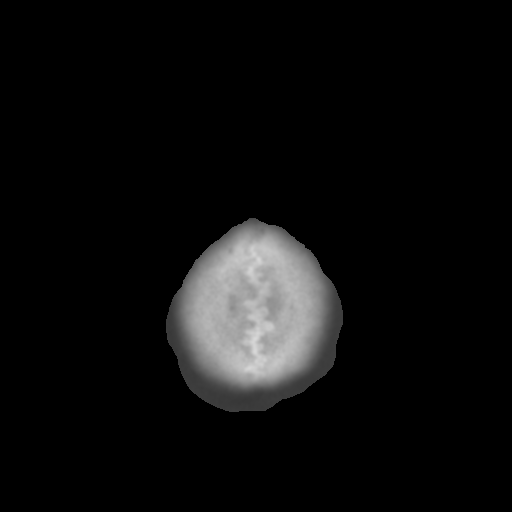

[Series 4: coronal soft tissue · coronal · 0.30mm/px · 3 of 67 slices shown]
[im 23/67  brain]
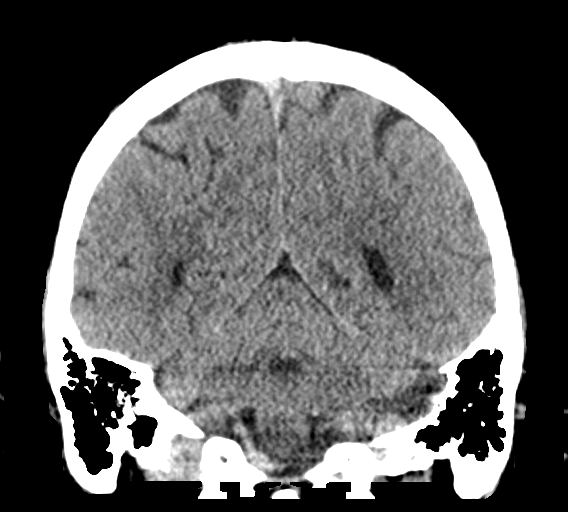
[im 30/67  brain]
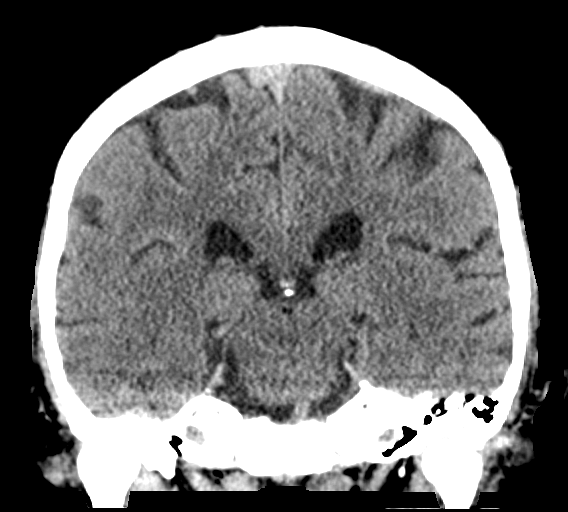
[im 37/67  brain]
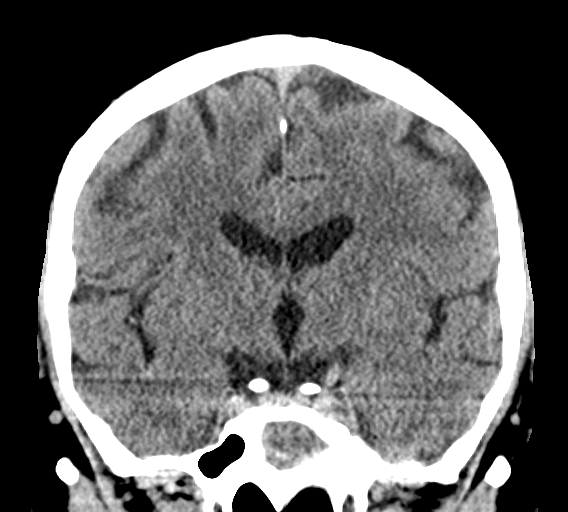

[Series 5: sagittal soft tissue · sagittal · 0.30mm/px · 3 of 58 slices shown]
[im 20/58  brain]
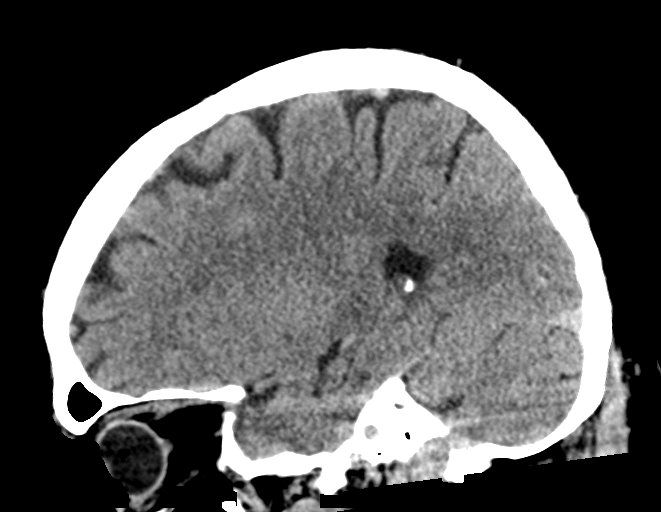
[im 29/58  brain]
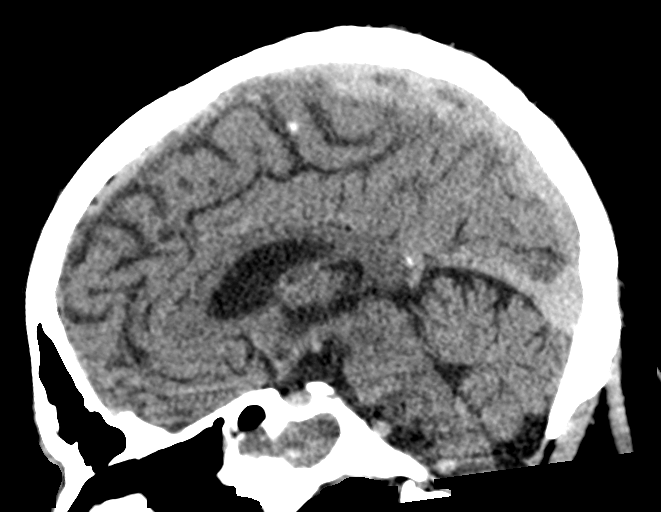
[im 39/58  brain]
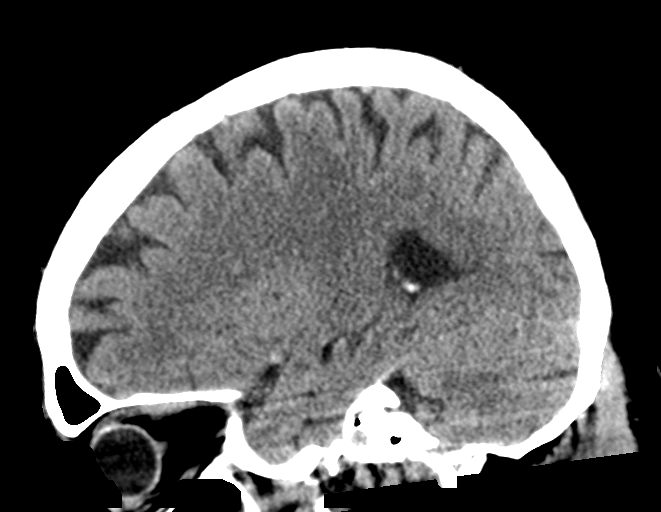

[15 of 47 positions shown; findings below may reference images not displayed]

FINDINGS: Brain: Ventricles and sulci are normal in size and configuration.
There is no intracranial mass, hemorrhage, extra-axial fluid
collection, or midline shift. The brain parenchyma appears
unremarkable. There is no appreciable acute infarct.

Vascular: No hyperdense vessel.  No evident vascular calcification.

Skull: The bony calvarium appears intact.

Sinuses/Orbits: There is diffuse opacification of the left maxillary
antrum. There is mild mucosal thickening in the right maxillary
antrum. There is opacification in the visualized left maxillary
antrum along the periphery as well as opacification in several
ethmoid air cells. Orbits appear symmetric bilaterally.

Other: Mastoid air cells are clear.
IMPRESSION: Multifocal paranasal sinus disease.  Study otherwise unremarkable.

## 2020-05-06 IMAGING — MR MR MRV HEAD WO/W CM
4 of 6 series · 13 of 48 positions shown · IV contrast (8ml Gadavist)
Comparison: None.

CLINICAL DATA: Headache and encephalopathy

EXAM:
MRI HEAD WITHOUT AND WITH CONTRAST
MRV HEAD WITHOUT AND WITH CONTRAST
TECHNIQUE: Multiplanar, multiecho pulse sequences of the brain and surrounding
structures were obtained without and with intravenous contrast.
Angiographic images of the intracranial venous structures were
obtained using MRV technique without and with intravenous contrast.
CONTRAST:  8mL GADAVIST GADOBUTROL 1 MMOL/ML IV SOLN

[Series 1: TOF · coronal · 2.5mm · 0.98mm/px · 3 of 108 slices shown]
[im 22/108]
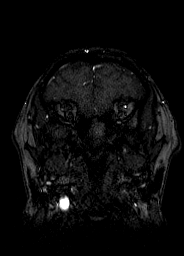
[im 65/108]
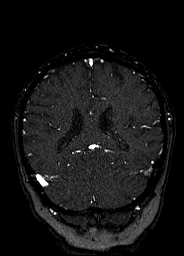
[im 108/108]
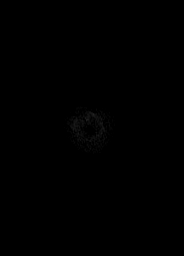

[Series 8: T1 · sagittal · 1.0mm · 0.98mm/px · 3 of 176 slices shown]
[im 26/176]
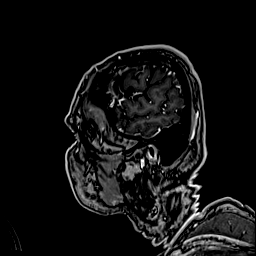
[im 101/176]
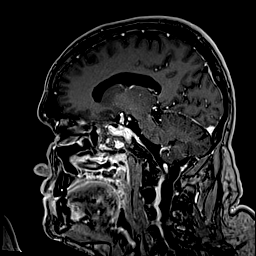
[im 151/176]
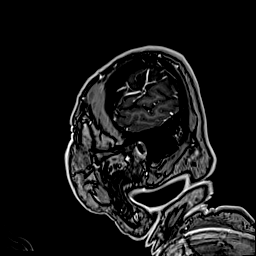

[Series 1057: T1 post-contrast · coronal · 1.0mm · 0.28mm/px · 4 of 203 slices shown (1 of 2)]
[im 1/203]
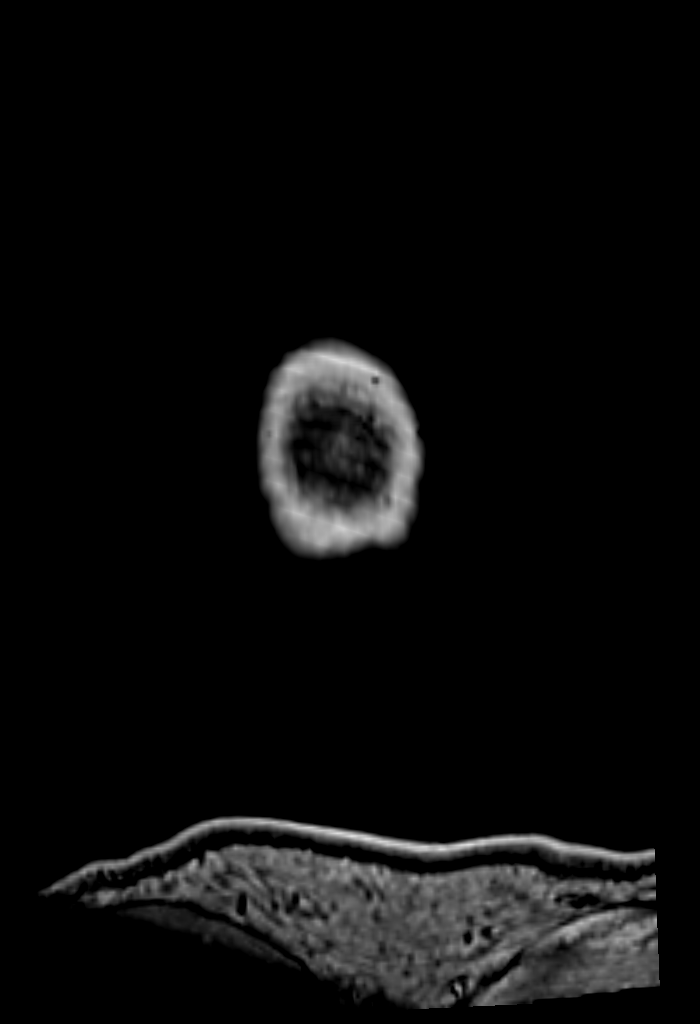
[im 26/203]
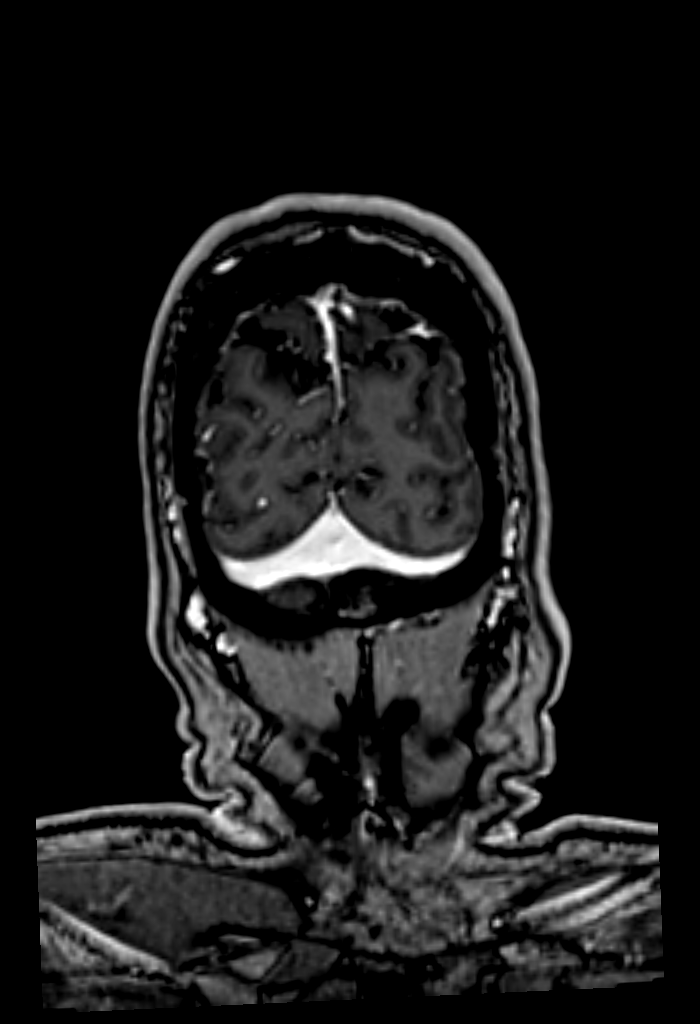
[im 102/203]
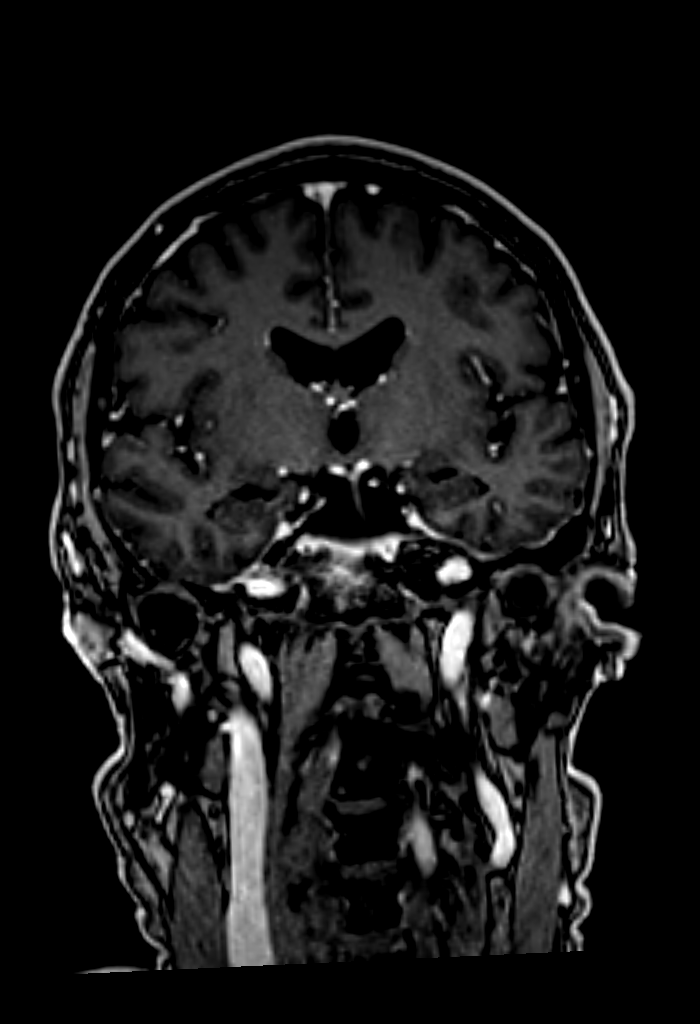
[im 177/203]
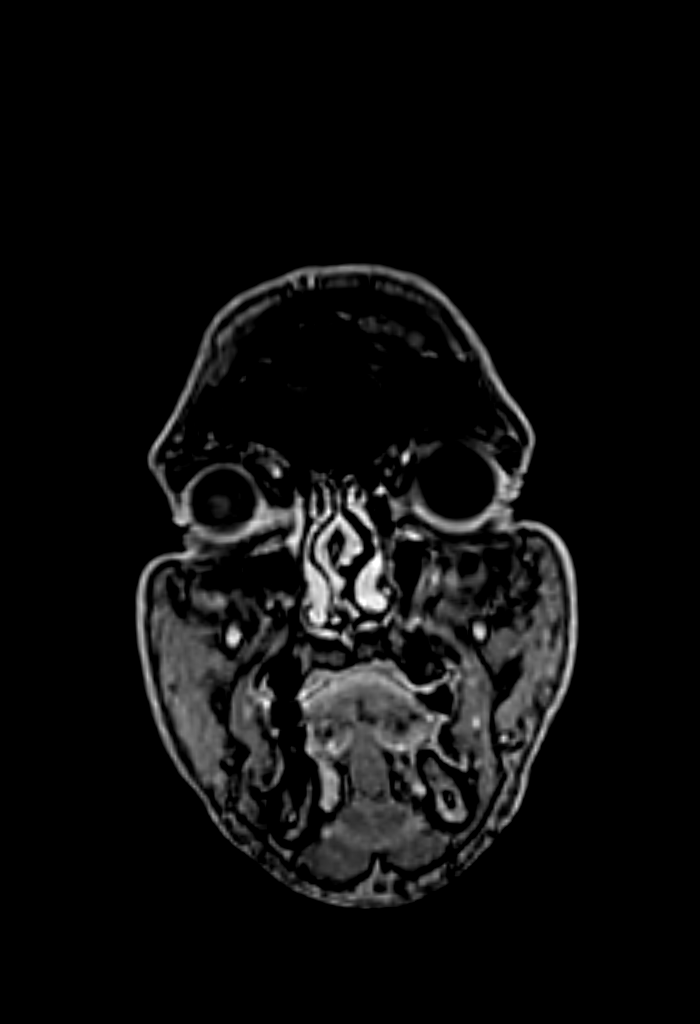

[Series 1064: T1 post-contrast · axial · 1.0mm · 0.29mm/px · z∈[-131,+54]mm · 3 of 237 slices shown (2 of 2)]
[im 24/237]
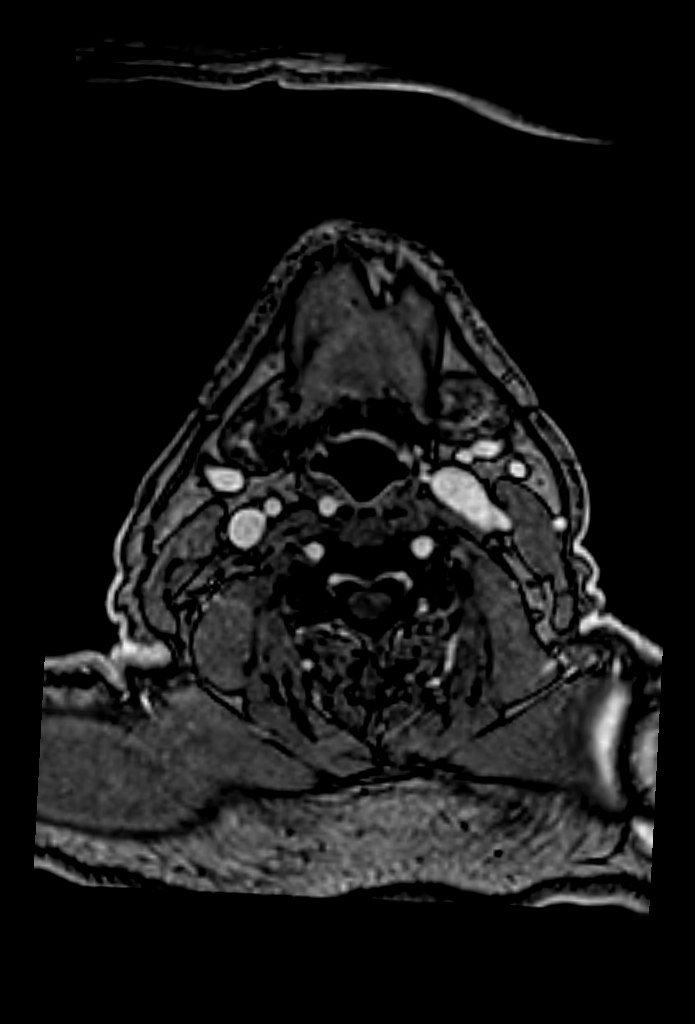
[im 119/237]
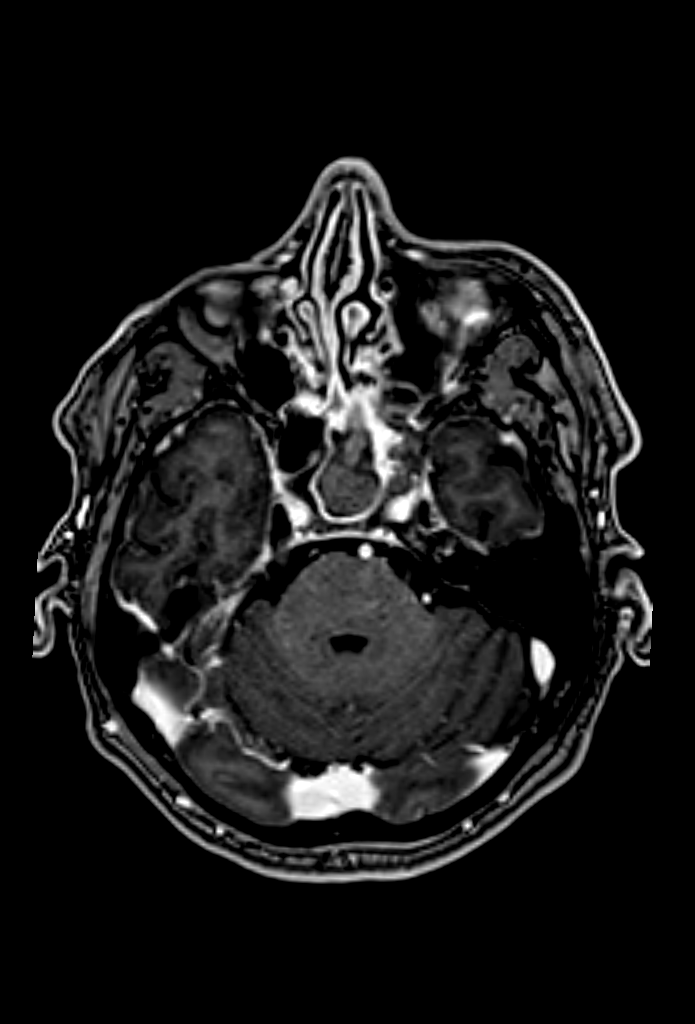
[im 213/237]
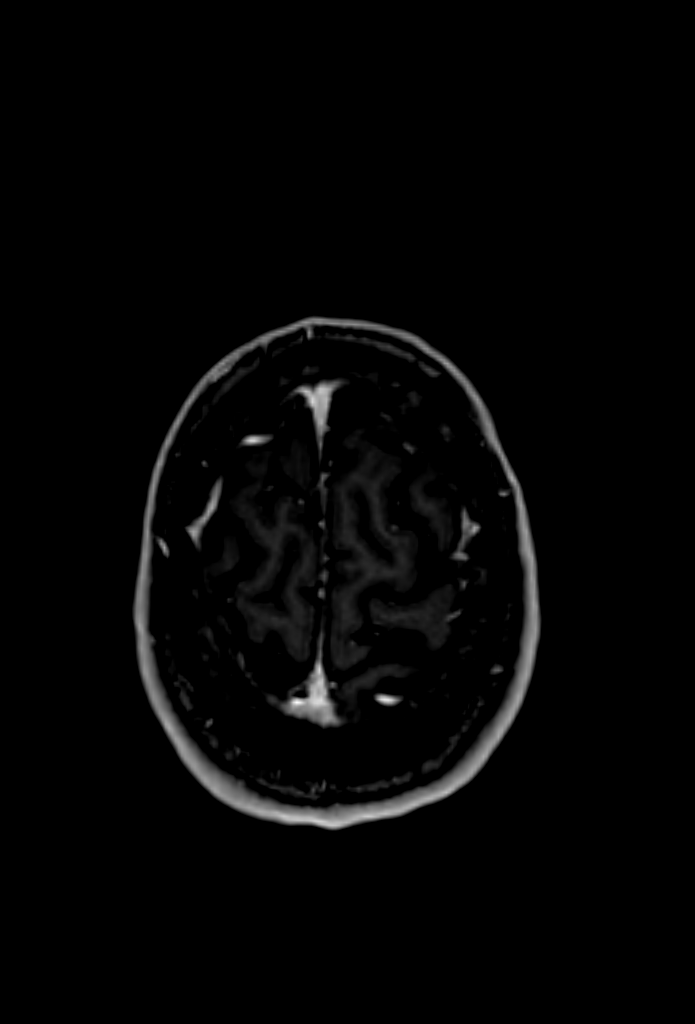

[13 of 48 positions shown; findings below may reference images not displayed]

FINDINGS: MRI BRAIN FINDINGS

Brain: No acute infarct, mass effect or extra-axial collection. No
acute or chronic hemorrhage. Normal white matter signal, parenchymal
volume and CSF spaces. The midline structures are normal.

Vascular: Major flow voids are preserved.

Skull and upper cervical spine: Normal calvarium and skull base.
Visualized upper cervical spine and soft tissues are normal.

Sinuses/Orbits:Sphenoid sinus mucosal thickening and opacification.
Normal orbits.

MRV BRAIN FINDINGS

Superior sagittal sinus: Normal.

Straight sinus: Normal.

Inferior sagittal sinus, vein of TEODORIO and internal cerebral veins:
Normal.

Transverse sinuses: Normal.

Sigmoid sinuses: Normal.

Visualized jugular veins: Normal.
IMPRESSION: 1. No acute intracranial abnormality.
2. Normal intracranial MRV.

## 2020-05-06 MED ORDER — PREDNISONE 10 MG PO TABS
10.0000 mg | ORAL_TABLET | Freq: Every day | ORAL | Status: AC
  Administered 2020-05-08: 10 mg via ORAL
  Filled 2020-05-06: qty 1

## 2020-05-06 MED ORDER — SODIUM CHLORIDE 0.9 % IV SOLN
12.5000 mg | Freq: Once | INTRAVENOUS | Status: AC
  Administered 2020-05-06: 12.5 mg via INTRAVENOUS
  Filled 2020-05-06 (×2): qty 0.5

## 2020-05-06 MED ORDER — ALBUTEROL SULFATE HFA 108 (90 BASE) MCG/ACT IN AERS
2.0000 | INHALATION_SPRAY | RESPIRATORY_TRACT | Status: DC | PRN
  Filled 2020-05-06: qty 6.7

## 2020-05-06 MED ORDER — GADOBUTROL 1 MMOL/ML IV SOLN
8.0000 mL | Freq: Once | INTRAVENOUS | Status: AC | PRN
  Administered 2020-05-06: 8 mL via INTRAVENOUS

## 2020-05-06 MED ORDER — PREDNISONE 20 MG PO TABS
20.0000 mg | ORAL_TABLET | Freq: Every day | ORAL | Status: AC
  Administered 2020-05-07: 20 mg via ORAL
  Filled 2020-05-06: qty 1

## 2020-05-06 MED ORDER — HYDRALAZINE HCL 20 MG/ML IJ SOLN
5.0000 mg | INTRAMUSCULAR | Status: DC | PRN
  Administered 2020-05-06 (×2): 5 mg via INTRAVENOUS
  Filled 2020-05-06 (×2): qty 1

## 2020-05-06 MED ORDER — ENOXAPARIN SODIUM 40 MG/0.4ML ~~LOC~~ SOLN
40.0000 mg | SUBCUTANEOUS | Status: DC
  Administered 2020-05-06: 40 mg via SUBCUTANEOUS
  Filled 2020-05-06: qty 0.4

## 2020-05-06 MED ORDER — ONDANSETRON HCL 4 MG/2ML IJ SOLN
4.0000 mg | Freq: Three times a day (TID) | INTRAMUSCULAR | Status: DC | PRN
Start: 2020-05-06 — End: 2020-05-09
  Administered 2020-05-06: 4 mg via INTRAVENOUS
  Filled 2020-05-06: qty 2

## 2020-05-06 MED ORDER — AMOXICILLIN-POT CLAVULANATE 875-125 MG PO TABS
1.0000 | ORAL_TABLET | Freq: Two times a day (BID) | ORAL | Status: DC
  Administered 2020-05-06 – 2020-05-09 (×6): 1 via ORAL
  Filled 2020-05-06 (×6): qty 1

## 2020-05-06 MED ORDER — MORPHINE SULFATE (PF) 2 MG/ML IV SOLN
2.0000 mg | INTRAVENOUS | Status: DC | PRN
  Administered 2020-05-06: 2 mg via INTRAVENOUS
  Filled 2020-05-06: qty 1

## 2020-05-06 MED ORDER — ACETAMINOPHEN 325 MG PO TABS
650.0000 mg | ORAL_TABLET | Freq: Four times a day (QID) | ORAL | Status: DC | PRN
  Administered 2020-05-06 – 2020-05-08 (×3): 650 mg via ORAL
  Filled 2020-05-06 (×3): qty 2

## 2020-05-06 MED ORDER — MORPHINE SULFATE (PF) 4 MG/ML IV SOLN
4.0000 mg | Freq: Once | INTRAVENOUS | Status: AC
Start: 2020-05-06 — End: 2020-05-06
  Administered 2020-05-06: 4 mg via INTRAVENOUS
  Filled 2020-05-06: qty 1

## 2020-05-06 MED ORDER — DM-GUAIFENESIN ER 30-600 MG PO TB12: 1.0000 | ORAL_TABLET | Freq: Two times a day (BID) | ORAL | Status: DC | PRN

## 2020-05-06 MED ORDER — ASPIRIN EC 81 MG PO TBEC
81.0000 mg | DELAYED_RELEASE_TABLET | Freq: Every day | ORAL | Status: DC
  Administered 2020-05-07 – 2020-05-09 (×3): 81 mg via ORAL
  Filled 2020-05-06 (×3): qty 1

## 2020-05-06 MED ORDER — PREDNISONE 10 MG (21) PO TBPK: 10.0000 mg | ORAL_TABLET | Freq: Every day | ORAL | Status: DC

## 2020-05-06 MED ORDER — NITROGLYCERIN 0.4 MG SL SUBL: 0.4000 mg | SUBLINGUAL_TABLET | SUBLINGUAL | Status: DC | PRN

## 2020-05-06 NOTE — ED Notes (Signed)
Dr. Blaine Hamper notified of elevated BP. MD to follow up.
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
## 2020-05-06 NOTE — ED Notes (Signed)
Patient placed on 2L A[place].

## 2020-05-06 NOTE — ED Triage Notes (Signed)
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  Pt was given 324mg  ASA and nitro paste and zofran  en route.  Pt currently denies chest pain.  Reports frontal headache 10/10 with blurred vision. Pt currently being treated for floating fungal infection behind ears.  CBG 160

## 2020-05-06 NOTE — Progress Notes (Addendum)
Patient went down to MRI.  Unable to get vital signs at 7 pm.  Will recheck vitals once he returns.   Night nurse aware of yellow MEWs protocol.

## 2020-05-06 NOTE — Progress Notes (Signed)
   05/06/20 1944  Assess: MEWS Score  Temp 98.4 F (36.9 C)  BP (!) 164/96  Pulse Rate 64  Resp 20  Level of Consciousness Alert  SpO2 97 %  O2 Device Room Air  Assess: MEWS Score  MEWS Temp 0  MEWS Systolic 0  MEWS Pulse 0  MEWS RR 0  MEWS LOC 0  MEWS Score 0  MEWS Score Color Green  Treat  Pain Scale 0-10  Pain Score 0  Document  Patient Outcome Stabilized after interventions

## 2020-05-06 NOTE — ED Provider Notes (Signed)
[facility] Emergency Department Provider Note ____________________________________________   Event Date/Time   First MD Initiated Contact with Patient 05/06/20 1110     (approximate)  I have reviewed the triage vital signs and the nursing notes.  HISTORY  Chief Complaint Headache, Chest Pain, and Dizziness   HPI Marco Carpenter is a 65 y.o. malewho presents to the ED for evaluation of headache, confusion, chest pain.   Chart review indicates remote former smoker.  No further medical history.  Takes no regular prescription medications. Patient presents with his son, who is an EMT-B, and son reports that his father is healthy and obstinate.  Reports his father is acting strangely and he has never seen him this way.  Patient presents to the ED via EMS for evaluation of acute onset headache and confusion.  Patient has difficulty providing thorough history due to his confusion and pain, thereby limiting history. He reports he was at work when he started feeling badly, which he elaborates as being a headache, "and then I do not know."  EMS reports the concern for chest pain and patient denies this here in the ED.  He continues to report headache.  Past Medical History:  Diagnosis Date  . Medical history non-contributory     Patient Active Problem List   Diagnosis Date Noted  . Acute metabolic encephalopathy 32/35/5732  . Chest pain 05/06/2020  . Elevated troponin 05/06/2020  . Headache 05/06/2020  . Leukocytosis 05/06/2020    Past Surgical History:  Procedure Laterality Date  . COLONOSCOPY WITH PROPOFOL N/A 05/29/2015   Procedure: COLONOSCOPY WITH PROPOFOL;  Surgeon: Lollie Sails, MD;  Location: [facility] ENDOSCOPY;  Service: Endoscopy;  Laterality: N/A;  . NO PAST SURGERIES      Prior to Admission medications   Not on File    Allergies Patient has no known allergies.  No family history on file.  Social History Social History    Tobacco Use  . Smoking status: Former Smoker    Quit date: 05/29/1995    Years since quitting: 24.9  Substance Use Topics  . Alcohol use: No  . Drug use: No    Review of Systems  Unable to be accurately assessed due to patient's altered mentation and acuity ____________________________________________   PHYSICAL EXAM:  VITAL SIGNS: Vitals:   05/06/20 1200 05/06/20 1300  BP: (!) 172/101   Pulse: (!) 54 (!) 55  Resp: 16 15  Temp:    SpO2: 95% 95%     Constitutional: Alert and disoriented.  Appears uncomfortable.  Looking around in a stunned fashion.  Follows commands in all 4 extremities. Eyes: Conjunctivae are normal. PERRL. EOMI. Head: Atraumatic. Nose: No congestion/rhinnorhea. Mouth/Throat: Mucous membranes are moist.  Oropharynx non-erythematous. Neck: No stridor. No cervical spine tenderness to palpation. Cardiovascular: Normal rate, regular rhythm. Grossly normal heart sounds.  Good peripheral circulation. Respiratory: Normal respiratory effort.  No retractions. Lungs CTAB. Gastrointestinal: Soft , nondistended, nontender to palpation. No CVA tenderness. Musculoskeletal: No lower extremity tenderness nor edema.  No joint effusions. No signs of acute trauma. Neurologic:  Normal speech and language. No gross focal neurologic deficits are appreciated. Cranial nerves II through XII intact 5/5 strength and sensation in all 4 extremities Skin:  Skin is warm, dry and intact. No rash noted. Psychiatric: Mood and affect are normal. Speech and behavior are normal.  ____________________________________________   LABS (all labs ordered are listed, but only abnormal results are displayed)  Labs Reviewed  COMPREHENSIVE METABOLIC PANEL -  Abnormal; Notable for the following components:      Result Value   Glucose, Bld 132 (*)    Calcium 8.6 (*)    All other components within normal limits  CBC WITH DIFFERENTIAL/PLATELET - Abnormal; Notable for the following components:    WBC 12.5 (*)    Neutro Abs 9.2 (*)    Abs Immature Granulocytes 0.29 (*)    All other components within normal limits  TROPONIN I (HIGH SENSITIVITY) - Abnormal; Notable for the following components:   Troponin I (High Sensitivity) 25 (*)    All other components within normal limits  RESP PANEL BY RT-PCR (FLU A&B, COVID) ARPGX2  CULTURE, BLOOD (ROUTINE X 2)  CULTURE, BLOOD (ROUTINE X 2)  URINE DRUG SCREEN, QUALITATIVE ([facility] ONLY)  URINALYSIS, COMPLETE (UACMP) WITH MICROSCOPIC  D-DIMER, QUANTITATIVE  TROPONIN I (HIGH SENSITIVITY)   ____________________________________________  12 Lead EKG  Sinus rhythm, rate of 54 bpm.  Normal axis and intervals.  No evidence of acute ischemia.  Sinus bradycardia.  No comparison. ____________________________________________  RADIOLOGY  ED MD interpretation: CT head reviewed by me without evidence of acute intracranial pathology  Official radiology report(s): CT Head Wo Contrast  Result Date: 05/06/2020 CLINICAL DATA:  Altered mental status and headache EXAM: CT HEAD WITHOUT CONTRAST TECHNIQUE: Contiguous axial images were obtained from the base of the skull through the vertex without intravenous contrast. COMPARISON:  March 29, 2020 FINDINGS: Brain: Ventricles and sulci are normal in size and configuration. There is no intracranial mass, hemorrhage, extra-axial fluid collection, or midline shift. The brain parenchyma appears unremarkable. There is no appreciable acute infarct. Vascular: No hyperdense vessel.  No evident vascular calcification. Skull: The bony calvarium appears intact. Sinuses/Orbits: There is diffuse opacification of the left maxillary antrum. There is mild mucosal thickening in the right maxillary antrum. There is opacification in the visualized left maxillary antrum along the periphery as well as opacification in several ethmoid air cells. Orbits appear symmetric bilaterally. Other: Mastoid air cells are clear. IMPRESSION: Multifocal  paranasal sinus disease.  Study otherwise unremarkable. Electronically Signed   By: Lowella Grip III M.D.   On: 05/06/2020 12:05   DG Chest Portable 1 View  Result Date: 05/06/2020 CLINICAL DATA:  Hypoxia with chest pain. EXAM: PORTABLE CHEST 1 VIEW COMPARISON:  None. FINDINGS: 1200 hours. Low volume film. Cardiopericardial silhouette is at upper limits of normal for size. There is pulmonary vascular congestion without overt pulmonary edema. The visualized bony structures of the thorax show no acute abnormality. Telemetry leads overlie the chest. IMPRESSION: Low volume film with pulmonary vascular congestion. Electronically Signed   By: Misty Stanley M.D.   On: 05/06/2020 12:48   ____________________________________________   PROCEDURES and INTERVENTIONS  Procedure(s) performed (including Critical Care):  .1-3 Lead EKG Interpretation Performed by: Vladimir Crofts, MD Authorized by: Vladimir Crofts, MD     Interpretation: normal     ECG rate:  56   ECG rate assessment: normal     Rhythm: sinus bradycardia     Ectopy: none     Conduction: normal      Medications  ondansetron (ZOFRAN) injection 4 mg (has no administration in time range)  acetaminophen (TYLENOL) tablet 650 mg (has no administration in time range)  albuterol (VENTOLIN HFA) 108 (90 Base) MCG/ACT inhaler 2 puff (has no administration in time range)  dextromethorphan-guaiFENesin (MUCINEX DM) 30-600 MG per 12 hr tablet 1 tablet (has no administration in time range)  morphine 4 MG/ML injection 4 mg (4 mg Intravenous  Given 05/06/20 1143)    ____________________________________________   MDM / ED COURSE   Quite healthy 65 year old male with minimal medical history presents to the ED acutely confused with headache of uncertain etiology and requiring medical admission.  He presents hypertensive 170/110.  Exam demonstrates nonfocal confusion and disorientation.  He has no signs of trauma, distress or any neurologic or vascular  deficits.  He is oriented to self and guesses the appropriate location, but is disoriented to year, time, day of the week and is amnestic of the events that occurred earlier today.  Son at the bedside reports this is unusual and he has never seen his father that this way.  Quick CT head obtained and ruled out acute SAH or other ICH, and without evidence of acute CVA.  Blood work with nonfocal and mild leukocytosis and without evidence of metabolic pathology.  First troponin is marginally elevated and his EKG is nonischemic.  He is reporting no chest pain here.  Uncertain etiology of patient's symptoms, global transient amnesia is a possibility.  No tachycardia or chest pain to suggest PE.  He has a transient desaturation to the upper 80s while being transferred from CT, but remains on room air after this for least an hour with good saturations up until this writing.  CXR without evidence of acute cardiopulmonary pathology.  MRI pending at the time of admission.  Admitted to medicine for further work-up and management with neurology consulting.  Neurology in the ED now to assess the patient.  Clinical Course as of 05/06/20 1337  Sat May 06, 2020  1138 Patient slightly hypertensive at 170/110.  After my initial evaluation he is immediately taken to CT for CT head to rule out acute bleed [DS]  1222 Reassessed.  Son remains at the bedside.  Patient reports feeling better now and he seems more comfortable.  Repeatedly reports headache, improved with morphine, but persistent.  While I am examining the patient and performing another neurological exam, I noticed that he asks his son on 3 separate occasions if his wife is okay and each time his son affirms that she is.  He continues to have amnesia of the events today.  He is disoriented to time.  He has a nonfocal neurologic exam [DS]  1225 Neuro paged [DS]  1252 I discuss the case with neuro on call, Dr. Cheral Marker, who recommends MRI and he will come evaluate [DS]     Clinical Course User Index [DS] Vladimir Crofts, MD    ____________________________________________   FINAL CLINICAL IMPRESSION(S) / ED DIAGNOSES  Final diagnoses:  Bad headache  Amnesia     ED Discharge Orders    None       Michaeljames Milnes Tamala Julian   Note:  This document was prepared using Dragon voice recognition software and may include unintentional dictation errors.   Vladimir Crofts, MD 05/06/20 1340

## 2020-05-06 NOTE — ED Notes (Signed)
Patient oxygen decreased to around 86-88% when transferring during CT. Patient felt SOB. After resting, sats returned to within normal limits. Dr. Tamala Julian notified.

## 2020-05-06 NOTE — H&P (Addendum)
History and Physical    Marco Carpenter KAJ:[id number] DOB: 1955/06/22 DOA: 05/06/2020  Referring MD/NP/PA:   PCP: Dion Body, MD   Patient coming from:  The patient is coming from home.  At baseline, pt is independent for most of ADL.        Chief Complaint: AMS, chest pain, headache, amnesia  HPI: Marco Carpenter is a 65 y.o. male with medical history significant of former smoker, chronic sphenoid sinusitis, who presents with altered mental status and chest pain, and HA and amnesia.  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 His chest pain was located in substernal area, mild, associated with shortness breath.  Patient does not have cough, fever or chills.  Patient had 1 episode of oxygen desaturation to 86% on room air when he was being transferred for CT scan. His oxygen saturation then improved to 95% on room air.  Currently his chest pain has subsided.  Patient complains of severe headache, which is located in the right frontal area, above right eye, constant, 10 out of 10 severity, nonradiating.  Associated with blurry vision.  Patient does not have unilateral weakness, no facial droop or slurred speech.  Patient is also confused and has amnesia.  Of note, patient has chronic left sphenoid sinusitis.  He was given prescription of amoxicillin and prednisone by ENT on 05/01/2020.  Patient does not have nausea vomiting, diarrhea or abdominal pain no symptoms of UTI.  CT-head on 03/29/20 showed Severe chronic left sphenoid sinusitis with complete opacification  of the sinus and expansion of the left sphenoid sinus ostium.  Additional disease within the left frontal, bilateral ethmoid, right  sphenoid and left maxillary sinuses as described. Consider ENT  consultation.   ED Course: pt was found to have WBC 12.5, troponin level 25, negative Covid PCR, negative  D-dimer 0.48, electrolytes renal function okay, temperature normal, blood pressure 172/101, heart rate 54, RR 24, 16, oxygen saturation 95% on room air currently.  Chest x-ray showed low volume and vascular congestion.  Patient is placed on progressive bed as inpatient.  Dr. Cheral Marker of neurology, Dr. Ubaldo Glassing of cardiology are consulted.   CT-head showed: Multifocal paranasal sinus disease.  Study otherwise unremarkable.  Review of Systems: Could not be reviewed accurately due to altered mental status   Allergy: No Known Allergies  Past Medical History:  Diagnosis Date  . Medical history non-contributory     Past Surgical History:  Procedure Laterality Date  . COLONOSCOPY WITH PROPOFOL N/A 05/29/2015   Procedure: COLONOSCOPY WITH PROPOFOL;  Surgeon: Lollie Sails, MD;  Location: [facility] ENDOSCOPY;  Service: Endoscopy;  Laterality: N/A;  . NO PAST SURGERIES      Social History:  reports that he quit smoking about 24 years ago. He does not have any smokeless tobacco history on file. He reports that he does not drink alcohol and does not use drugs.  Family History:  Family History  Problem Relation Age of Onset  . Stroke Father      Prior to Admission medications   Not on File    Physical Exam: Vitals:   05/06/20 1200 05/06/20 1300 05/06/20 1350 05/06/20 1448  BP: (!) 172/101  (!) 183/97 (!) 162/91  Pulse: (!) 54 (!) 55 (!) 55 65  Resp: _0 (!) 24  Temp:    97.9 F (36.6 C)  TempSrc:    Oral  SpO2: 95% 95% 97% 99%  Weight:      Height:       General: Not in acute distress HEENT:       Eyes: PERRL, EOMI, no scleral icterus.       ENT: No discharge from the ears and nose       Neck: No JVD, no bruit, no mass felt. Heme: No neck lymph node enlargement. Cardiac: S1/S2, RRR, No murmurs, No gallops or rubs. Respiratory: No rales, wheezing, rhonchi or rubs. GI: Soft, nondistended, nontender, no organomegaly, BS present. GU: No hematuria Ext: No pitting leg edema  bilaterally. 1+DP/PT pulse bilaterally. Musculoskeletal: No joint deformities, No joint redness or warmth, no limitation of ROM in spin. Skin: No rashes.  Neuro: Confused, orientated to person and place, not to the time. Cranial nerves II-XII grossly intact, moves all extremities Psych: Patient is not psychotic, no suicidal or hemocidal ideation.  Labs on Admission: I have personally reviewed following labs and imaging studies  CBC: Recent Labs  Lab 05/06/20 1105  WBC 12.5*  NEUTROABS 9.2*  HGB 16.9  HCT 49.4  MCV 89.2  PLT 811   Basic Metabolic Panel: Recent Labs  Lab 05/06/20 1105  NA 136  K 3.5  CL 104  CO2 22  GLUCOSE 132*  BUN 19  CREATININE 1.02  CALCIUM 8.6*   GFR: Estimated Creatinine Clearance: 79.2 mL/min (by C-G formula based on SCr of 1.02 mg/dL). Liver Function Tests: Recent Labs  Lab 05/06/20 1105  AST 22  ALT 22  ALKPHOS 75  BILITOT 0.9  PROT 7.5  ALBUMIN 4.4   No results for input(s): LIPASE, AMYLASE in the last 168 hours. No results for input(s): AMMONIA in the last 168 hours. Coagulation Profile: No results for input(s): INR, PROTIME in the last 168 hours. Cardiac Enzymes: No results for input(s): CKTOTAL, CKMB, CKMBINDEX, TROPONINI in the last 168 hours. BNP (last 3 results) No results for input(s): PROBNP in the last 8760 hours. HbA1C: No results for input(s): HGBA1C in the last 72 hours. CBG: No results for input(s): GLUCAP in the last 168 hours. Lipid Profile: No results for input(s): CHOL, HDL, LDLCALC, TRIG, CHOLHDL, LDLDIRECT in the last 72 hours. Thyroid Function Tests: No results for input(s): TSH, T4TOTAL, FREET4, T3FREE, THYROIDAB in the last 72 hours. Anemia Panel: No results for input(s): VITAMINB12, FOLATE, FERRITIN, TIBC, IRON, RETICCTPCT in the last 72 hours. Urine analysis: No results found for: COLORURINE, APPEARANCEUR, LABSPEC, PHURINE, GLUCOSEU, HGBUR, BILIRUBINUR, KETONESUR, PROTEINUR, UROBILINOGEN, NITRITE,  LEUKOCYTESUR Sepsis Labs: $[id number]$ (procalcitonin:4,lacticidven:4) ) Recent Results (from the past 240 hour(s))  Resp Panel by RT-PCR (Flu A&B, Covid) Nasopharyngeal Swab     Status: None   Collection Time: 05/06/20 12:51 PM   Specimen: Nasopharyngeal Swab; Nasopharyngeal(NP) swabs in vial transport medium  Result Value Ref Range Status   SARS Coronavirus 2 by RT PCR NEGATIVE NEGATIVE Final    Comment: (NOTE) SARS-CoV-2 target nucleic acids are NOT DETECTED.  The SARS-CoV-2 RNA is generally detectable in upper respiratory specimens during the acute phase of infection. The lowest concentration of SARS-CoV-2 viral copies this assay can detect is 138 copies/mL. A negative result does not preclude SARS-Cov-2 infection and should not be used as the sole basis for treatment or other patient management decisions. A negative result may occur with  improper specimen collection/handling, submission of specimen other than nasopharyngeal swab, presence of viral mutation(s) within the areas targeted by this assay, and inadequate number of viral copies(<138  copies/mL). A negative result must be combined with clinical observations, patient history, and epidemiological information. The expected result is Negative.  Fact Sheet for Patients:  BloggerCourse.com  Fact Sheet for Healthcare Providers:  SeriousBroker.it  This test is no t yet approved or cleared by the [place] FDA and  has been authorized for detection and/or diagnosis of SARS-CoV-2 by FDA under an Emergency Use Authorization (EUA). This EUA will remain  in effect (meaning this test can be used) for the duration of the COVID-19 declaration under Section 564(b)(1) of the Act, 21 U.S.C.section 360bbb-3(b)(1), unless the authorization is terminated  or revoked sooner.       Influenza A by PCR NEGATIVE NEGATIVE Final   Influenza B by PCR NEGATIVE NEGATIVE Final    Comment:  (NOTE) The Xpert Xpress SARS-CoV-2/FLU/RSV plus assay is intended as an aid in the diagnosis of influenza from Nasopharyngeal swab specimens and should not be used as a sole basis for treatment. Nasal washings and aspirates are unacceptable for Xpert Xpress SARS-CoV-2/FLU/RSV testing.  Fact Sheet for Patients: BloggerCourse.com  Fact Sheet for Healthcare Providers: SeriousBroker.it  This test is not yet approved or cleared by the [place] FDA and has been authorized for detection and/or diagnosis of SARS-CoV-2 by FDA under an Emergency Use Authorization (EUA). This EUA will remain in effect (meaning this test can be used) for the duration of the COVID-19 declaration under Section 564(b)(1) of the Act, 21 U.S.C. section 360bbb-3(b)(1), unless the authorization is terminated or revoked.  Performed at [facility], [address] [place], [place] [address]      Radiological Exams on Admission: CT Head Wo Contrast  Result Date: 05/06/2020 CLINICAL DATA:  Altered mental status and headache EXAM: CT HEAD WITHOUT CONTRAST TECHNIQUE: Contiguous axial images were obtained from the base of the skull through the vertex without intravenous contrast. COMPARISON:  March 29, 2020 FINDINGS: Brain: Ventricles and sulci are normal in size and configuration. There is no intracranial mass, hemorrhage, extra-axial fluid collection, or midline shift. The brain parenchyma appears unremarkable. There is no appreciable acute infarct. Vascular: No hyperdense vessel.  No evident vascular calcification. Skull: The bony calvarium appears intact. Sinuses/Orbits: There is diffuse opacification of the left maxillary antrum. There is mild mucosal thickening in the right maxillary antrum. There is opacification in the visualized left maxillary antrum along the periphery as well as opacification in several ethmoid air cells. Orbits appear symmetric  bilaterally. Other: Mastoid air cells are clear. IMPRESSION: Multifocal paranasal sinus disease.  Study otherwise unremarkable. Electronically Signed   By: Bretta Bang III M.D.   On: 05/06/2020 12:05   DG Chest Portable 1 View  Result Date: 05/06/2020 CLINICAL DATA:  Hypoxia with chest pain. EXAM: PORTABLE CHEST 1 VIEW COMPARISON:  None. FINDINGS: 1200 hours. Low volume film. Cardiopericardial silhouette is at upper limits of normal for size. There is pulmonary vascular congestion without overt pulmonary edema. The visualized bony structures of the thorax show no acute abnormality. Telemetry leads overlie the chest. IMPRESSION: Low volume film with pulmonary vascular congestion. Electronically Signed   By: Kennith Center M.D.   On: 05/06/2020 12:48     EKG: I have personally reviewed.  Sinus rhythm, QTC 412, nonspecific to change  Assessment/Plan Principal Problem:   Acute metabolic encephalopathy Active Problems:   Chest pain   Elevated troponin   Headache   Leukocytosis   Elevated blood pressure reading   Sphenoid sinusitis   Acute metabolic encephalopathy and headache: pt also has  amnesia. Etiology is not clear.  Dr. Cheral Marker of neurology is consulted.  Suspecting unusual presentation for migraine and transient global amnesia. Stroke and subclinical seizure are included in differential diagnosis.  -Related to progressive bed as inpatient -Frequent neuro check -Check UDS -f/u UA -Highly appreciated Dr. Yvetta Coder consultation will follow up recommendations as follows: Recommendations: 1. MRI brain with and without contrast, MRV with contrast (ordered) 2. EEG (ordered) 3. IV Phenergan 25 mg x 1 (ordered) 4. Primary team is ordering a D-dimer 5. ESR (ordered)  Chest pain and elevated troponin: trop 25. D-dimer negative. Dr. Ubaldo Glassing of card is consulted -pt recevied ASA 324 mg and then 81 mg daily -Trend troponin -As needed nitroglycerin and morphine for chest pain -Check A1c,  FLP -Check UDS  Addendum: trop is trending up 25 -->144 -->176 -->239 -will start IV heparin -will start lipitor - already gave update to Dr. Ubaldo Glassing in AM  Leukocytosis and sphenoid sinusitis: Leukocytosis with WBC 12.5 is at least partially due to steroid use.  Patient does not have fever clinically not septic -Continue home amoxicillin and prednisone -blood culture   Elevated blood pressure reading: Blood pressure 172/101, 183/97.  Patient seems to have history of whitecoat syndrome -As needed hydralazine    DVT ppx: SQ Lovenox -->IV heparin Code Status: Full code Family Communication: Yes, patient's son   at bed side Disposition Plan:  Anticipate discharge back to previous environment Consults called:  Dr. Cheral Marker of neurology, Dr. Ubaldo Glassing of cardiology are consulted.   Admission status and Level of care: Progressive Cardiac:  as inpt      Status is: Inpatient  Remains inpatient appropriate because:Inpatient level of care appropriate due to severity of illness   Dispo: The patient is from: Home              Anticipated d/c is to: Home              Patient currently is not medically stable to d/c.   Difficult to place patient No           Date of Service 05/06/2020    Ivor Costa [facility]   If 7PM-7AM, please contact night-coverage [email or web address] 05/06/2020, 4:36 PM

## 2020-05-07 DIAGNOSIS — G9341 Metabolic encephalopathy: Secondary | ICD-10-CM | POA: Diagnosis not present

## 2020-05-07 DIAGNOSIS — R519 Headache, unspecified: Secondary | ICD-10-CM | POA: Diagnosis not present

## 2020-05-07 LAB — CBC
HCT: 45.7 % (ref 39.0–52.0)
Hemoglobin: 16 g/dL (ref 13.0–17.0)
MCH: 31.6 pg (ref 26.0–34.0)
MCHC: 35 g/dL (ref 30.0–36.0)
MCV: 90.3 fL (ref 80.0–100.0)
Platelets: 294 10*3/uL (ref 150–400)
RBC: 5.06 MIL/uL (ref 4.22–5.81)
RDW: 13.3 % (ref 11.5–15.5)
WBC: 11.1 10*3/uL — ABNORMAL HIGH (ref 4.0–10.5)
nRBC: 0 % (ref 0.0–0.2)

## 2020-05-07 LAB — TROPONIN I (HIGH SENSITIVITY)
Troponin I (High Sensitivity): 97 ng/L — ABNORMAL HIGH (ref <18)
Troponin I (High Sensitivity): 84 ng/L — ABNORMAL HIGH (ref <18)
Troponin I (High Sensitivity): 57 ng/L — ABNORMAL HIGH (ref <18)

## 2020-05-07 LAB — PROTIME-INR
INR: 1.1 (ref 0.8–1.2)
Prothrombin Time: 13.5 seconds (ref 11.4–15.2)

## 2020-05-07 LAB — HEMOGLOBIN A1C
Hgb A1c MFr Bld: 5.4 % (ref 4.8–5.6)
Mean Plasma Glucose: 108.28 mg/dL

## 2020-05-07 LAB — BASIC METABOLIC PANEL
Anion gap: 9 (ref 5–15)
BUN: 17 mg/dL (ref 8–23)
CO2: 25 mmol/L (ref 22–32)
Calcium: 8.6 mg/dL — ABNORMAL LOW (ref 8.9–10.3)
Chloride: 103 mmol/L (ref 98–111)
Creatinine, Ser: 0.96 mg/dL (ref 0.61–1.24)
GFR, Estimated: >60 mL/min (ref >60)
Glucose, Bld: 90 mg/dL (ref 70–99)
Potassium: 3.9 mmol/L (ref 3.5–5.1)
Sodium: 137 mmol/L (ref 135–145)

## 2020-05-07 LAB — APTT: aPTT: 31 seconds (ref 24–36)

## 2020-05-07 LAB — LIPID PANEL
Cholesterol: 140 mg/dL (ref 0–200)
HDL: 29 mg/dL — ABNORMAL LOW (ref >40)
LDL Cholesterol: 87 mg/dL (ref 0–99)
Total CHOL/HDL Ratio: 4.8 RATIO
Triglycerides: 121 mg/dL (ref <150)
VLDL: 24 mg/dL (ref 0–40)

## 2020-05-07 LAB — HIV ANTIBODY (ROUTINE TESTING W REFLEX): HIV Screen 4th Generation wRfx: NONREACTIVE

## 2020-05-07 MED ORDER — HEPARIN (PORCINE) 25000 UT/250ML-% IV SOLN
950.0000 [IU]/h | INTRAVENOUS | Status: DC
  Administered 2020-05-07: 950 [IU]/h via INTRAVENOUS
  Filled 2020-05-07: qty 250

## 2020-05-07 MED ORDER — HEPARIN BOLUS VIA INFUSION
4000.0000 [IU] | Freq: Once | INTRAVENOUS | Status: AC
  Administered 2020-05-07: 4000 [IU] via INTRAVENOUS
  Filled 2020-05-07: qty 4000

## 2020-05-07 MED ORDER — ATORVASTATIN CALCIUM 20 MG PO TABS
40.0000 mg | ORAL_TABLET | Freq: Every day | ORAL | Status: DC
  Administered 2020-05-07 – 2020-05-09 (×3): 40 mg via ORAL
  Filled 2020-05-07 (×3): qty 2

## 2020-05-07 MED ORDER — ENOXAPARIN SODIUM 40 MG/0.4ML ~~LOC~~ SOLN
40.0000 mg | SUBCUTANEOUS | Status: DC
  Administered 2020-05-07 – 2020-05-08 (×2): 40 mg via SUBCUTANEOUS
  Filled 2020-05-07 (×2): qty 0.4

## 2020-05-07 MED ORDER — SODIUM CHLORIDE 0.9 % IV SOLN: INTRAVENOUS | Status: DC

## 2020-05-07 NOTE — Consult Note (Signed)
ANTICOAGULATION CONSULT NOTE  Pharmacy Consult for Heparin Infusion Indication: chest pain/ACS  Patient Measurements: Heparin Dosing Weight: 81.6 kg  Labs: Recent Labs    05/06/20 1105 05/06/20 1445 05/06/20 1609 05/06/20 2016 05/07/20 0531  HGB 16.9  --   --   --  16.0  HCT 49.4  --   --   --  45.7  PLT 345  --   --   --  294  CREATININE 1.02  --   --   --  0.96  TROPONINIHS 25* 144* 176* 239*  --     Estimated Creatinine Clearance: 84.2 mL/min (by C-G formula based on SCr of 0.96 mg/dL).   Medications:  No anticoagulation prior to admission per my chart review  Assessment: Patient is a 65 y/o M with medical history including former smoker, chronic sphenoid sinusitis who was BIBEMS 4/9 with sudden onset HA, nausea, chest pain, and SOB while doing yard work. Patient is now being admitted for acute metabolic encephalopathy and concern for ACS. Troponin 25 >> 239. Pharmacy has been consulted to initiate heparin infusion for ACS.  Baseline CBC WNL. Baseline aPTT and PT-INR pending.   Goal of Therapy:  Heparin level 0.3-0.7 units/ml Monitor platelets by anticoagulation protocol: Yes   Plan:  --Heparin 4000 unit IV bolus x 1 followed by continuous infusion at 950 units/hr --Heparin level 6 hours after initiation of infusion --Daily CBC per protocol while on IV heparin  Benita Gutter 05/07/2020,7:17 AM

## 2020-05-07 NOTE — Progress Notes (Signed)
PROGRESS NOTE    Marco Carpenter  MMH:[id number] DOB: 20-Jan-1956 DOA: 05/06/2020 PCP: Dion Body, MD    Brief Narrative:  Marco Carpenter is a 65 y.o. male with medical history significant of former smoker, chronic sphenoid sinusitis, who presents with altered mental status and chest pain, and HA and amnesia.  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 His chest pain was located in substernal area, mild, associated with shortness breath.  Patient does not have cough, fever or chills.  Patient had 1 episode of oxygen desaturation to 86% on room air when he was being transferred for CT scan. His oxygen saturation then improved to 95% on room air.  Currently his chest pain has subsided.  Patient complains of severe headache, which is located in the right frontal area, above right eye, constant, 10 out of 10 severity, nonradiating.  Associated with blurry vision.  Patient does not have unilateral weakness, no facial droop or slurred speech.  Patient is also confused and has amnesia.  Of note, patient has chronic left sphenoid sinusitis.  He was given prescription of amoxicillin and prednisone by ENT on 05/01/2020.  Patient does not have nausea vomiting, diarrhea or abdominal pain no symptoms of UTI.  CT head Multifocal paranasal sinus disease.  Study otherwise unremarkable   4/10-MS improved. Cp with palpation. MRI brain negative.   Consultants:   Neurology, cardiology  Procedures: CT and MRI  Antimicrobials:       Subjective: Had left-sided headache but none this AM.  Chest pain will palpating his chest.  No shortness of breath.  Objective: Vitals:   05/06/20 2339 05/07/20 0345 05/07/20 0745 05/07/20 1215  BP: 135/87 113/80 (!) 143/100 128/75  Pulse: 66 61 64 81  Resp: '16 16 12 16  ' Temp: 98.6 F (37 C) 98.8 F (37.1 C) 98.4 F (36.9 C) 98.2 F (36.8  C)  TempSrc: Oral Oral Oral Oral  SpO2: 95% 96% 96% 96%  Weight:      Height:        Intake/Output Summary (Last 24 hours) at 05/07/2020 1446 Last data filed at 05/07/2020 1346 Gross per 24 hour  Intake 1080.92 ml  Output --  Net 1080.92 ml   Filed Weights   05/06/20 1113  Weight: 81.6 kg    Examination:  General exam: Appears calm and comfortable  Respiratory system: Clear to auscultation. Respiratory effort normal. Cardiovascular system: S1 & S2 heard, RRR. No JVD, murmurs, rubs, gallops or clicks.  Gastrointestinal system: Abdomen is nondistended, soft and nontender.  Normal bowel sounds heard. Central nervous system: Alert and oriented.  Grossly intact Extremities: No edema Skin: Warm dry Psychiatry:  Mood & affect appropriate.     Data Reviewed: I have personally reviewed following labs and imaging studies  CBC: Recent Labs  Lab 05/06/20 1105 05/07/20 0531  WBC 12.5* 11.1*  NEUTROABS 9.2*  --   HGB 16.9 16.0  HCT 49.4 45.7  MCV 89.2 90.3  PLT 345 159   Basic Metabolic Panel: Recent Labs  Lab 05/06/20 1105 05/07/20 0531  NA 136 137  K 3.5 3.9  CL 104 103  CO2 22 25  GLUCOSE 132* 90  BUN 19 17  CREATININE 1.02 0.96  CALCIUM 8.6* 8.6*   GFR: Estimated Creatinine Clearance: 84.2 mL/min (by C-G formula based on SCr of 0.96 mg/dL). Liver Function Tests: Recent Labs  Lab 05/06/20 1105  AST 22  ALT 22  ALKPHOS 75  BILITOT 0.9  PROT 7.5  ALBUMIN 4.4   No results for input(s): LIPASE, AMYLASE in the last 168 hours. No results for input(s): AMMONIA in the last 168 hours. Coagulation Profile: Recent Labs  Lab 05/07/20 0728  INR 1.1   Cardiac Enzymes: No results for input(s): CKTOTAL, CKMB, CKMBINDEX, TROPONINI in the last 168 hours. BNP (last 3 results) No results for input(s): PROBNP in the last 8760 hours. HbA1C: Recent Labs    05/07/20 0531  HGBA1C 5.4   CBG: No results for input(s): GLUCAP in the last 168 hours. Lipid  Profile: Recent Labs    05/07/20 0531  CHOL 140  HDL 29*  LDLCALC 87  TRIG 121  CHOLHDL 4.8   Thyroid Function Tests: No results for input(s): TSH, T4TOTAL, FREET4, T3FREE, THYROIDAB in the last 72 hours. Anemia Panel: No results for input(s): VITAMINB12, FOLATE, FERRITIN, TIBC, IRON, RETICCTPCT in the last 72 hours. Sepsis Labs: No results for input(s): PROCALCITON, LATICACIDVEN in the last 168 hours.  Recent Results (from the past 240 hour(s))  Resp Panel by RT-PCR (Flu A&B, Covid) Nasopharyngeal Swab     Status: None   Collection Time: 05/06/20 12:51 PM   Specimen: Nasopharyngeal Swab; Nasopharyngeal(NP) swabs in vial transport medium  Result Value Ref Range Status   SARS Coronavirus 2 by RT PCR NEGATIVE NEGATIVE Final    Comment: (NOTE) SARS-CoV-2 target nucleic acids are NOT DETECTED.  The SARS-CoV-2 RNA is generally detectable in upper respiratory specimens during the acute phase of infection. The lowest concentration of SARS-CoV-2 viral copies this assay can detect is 138 copies/mL. A negative result does not preclude SARS-Cov-2 infection and should not be used as the sole basis for treatment or other patient management decisions. A negative result may occur with  improper specimen collection/handling, submission of specimen other than nasopharyngeal swab, presence of viral mutation(s) within the areas targeted by this assay, and inadequate number of viral copies(<138 copies/mL). A negative result must be combined with clinical observations, patient history, and epidemiological information. The expected result is Negative.  Fact Sheet for Patients:  EntrepreneurPulse.com.au  Fact Sheet for Healthcare Providers:  IncredibleEmployment.be  This test is no t yet approved or cleared by the [place] FDA and  has been authorized for detection and/or diagnosis of SARS-CoV-2 by FDA under an Emergency Use Authorization (EUA). This  EUA will remain  in effect (meaning this test can be used) for the duration of the COVID-19 declaration under Section 564(b)(1) of the Act, 21 U.S.C.section 360bbb-3(b)(1), unless the authorization is terminated  or revoked sooner.       Influenza A by PCR NEGATIVE NEGATIVE Final   Influenza B by PCR NEGATIVE NEGATIVE Final    Comment: (NOTE) The Xpert Xpress SARS-CoV-2/FLU/RSV plus assay is intended as an aid in the diagnosis of influenza from Nasopharyngeal swab specimens and should not be used as a sole basis for treatment. Nasal washings and aspirates are unacceptable for Xpert Xpress SARS-CoV-2/FLU/RSV testing.  Fact Sheet for Patients: EntrepreneurPulse.com.au  Fact Sheet for Healthcare Providers: IncredibleEmployment.be  This test is not yet approved or cleared by the [place] FDA and has been authorized for detection and/or diagnosis of SARS-CoV-2 by FDA under an Emergency Use Authorization (EUA). This EUA will remain in effect (meaning this test can be used) for the duration of the COVID-19 declaration under Section 564(b)(1) of the Act, 21 U.S.C. section 360bbb-3(b)(1), unless the authorization is terminated  or revoked.  Performed at [facility], [address] [place], [place] [address]   CULTURE, BLOOD (ROUTINE X 2) w Reflex to ID Panel     Status: None (Preliminary result)   Collection Time: 05/06/20  2:45 PM   Specimen: BLOOD  Result Value Ref Range Status   Specimen Description BLOOD BLOOD RIGHT HAND  Final   Special Requests   Final    BOTTLES DRAWN AEROBIC AND ANAEROBIC Blood Culture adequate volume   Culture   Final    NO GROWTH < 12 HOURS Performed at [facility], [address] [place], [place] [address]    Report Status PENDING  Incomplete  CULTURE, BLOOD (ROUTINE X 2) w Reflex to ID Panel     Status: None (Preliminary result)   Collection Time: 05/06/20  2:45 PM   Specimen: BLOOD   Result Value Ref Range Status   Specimen Description BLOOD BLOOD LEFT HAND  Final   Special Requests   Final    BOTTLES DRAWN AEROBIC AND ANAEROBIC Blood Culture adequate volume   Culture   Final    NO GROWTH < 12 HOURS Performed at [facility], [address] [place],  [address]    Report Status PENDING  Incomplete         Radiology Studies: CT Head Wo Contrast  Result Date: 05/06/2020 CLINICAL DATA:  Altered mental status and headache EXAM: CT HEAD WITHOUT CONTRAST TECHNIQUE: Contiguous axial images were obtained from the base of the skull through the vertex without intravenous contrast. COMPARISON:  March 29, 2020 FINDINGS: Brain: Ventricles and sulci are normal in size and configuration. There is no intracranial mass, hemorrhage, extra-axial fluid collection, or midline shift. The brain parenchyma appears unremarkable. There is no appreciable acute infarct. Vascular: No hyperdense vessel.  No evident vascular calcification. Skull: The bony calvarium appears intact. Sinuses/Orbits: There is diffuse opacification of the left maxillary antrum. There is mild mucosal thickening in the right maxillary antrum. There is opacification in the visualized left maxillary antrum along the periphery as well as opacification in several ethmoid air cells. Orbits appear symmetric bilaterally. Other: Mastoid air cells are clear. IMPRESSION: Multifocal paranasal sinus disease.  Study otherwise unremarkable. Electronically Signed   By: Lowella Grip III M.D.   On: 05/06/2020 12:05   MR BRAIN W WO CONTRAST  Result Date: 05/06/2020 CLINICAL DATA:  Headache and encephalopathy EXAM: MRI HEAD WITHOUT AND WITH CONTRAST MRV HEAD WITHOUT AND WITH CONTRAST TECHNIQUE: Multiplanar, multiecho pulse sequences of the brain and surrounding structures were obtained without and with intravenous contrast. Angiographic images of the intracranial venous structures were obtained using MRV technique without and  with intravenous contrast. CONTRAST:  99m GADAVIST GADOBUTROL 1 MMOL/ML IV SOLN COMPARISON:  None. FINDINGS: MRI BRAIN FINDINGS Brain: No acute infarct, mass effect or extra-axial collection. No acute or chronic hemorrhage. Normal white matter signal, parenchymal volume and CSF spaces. The midline structures are normal. Vascular: Major flow voids are preserved. Skull and upper cervical spine: Normal calvarium and skull base. Visualized upper cervical spine and soft tissues are normal. Sinuses/Orbits:Sphenoid sinus mucosal thickening and opacification. Normal orbits. MRV BRAIN FINDINGS Superior sagittal sinus: Normal. Straight sinus: Normal. Inferior sagittal sinus, vein of Galen and internal cerebral veins: Normal. Transverse sinuses: Normal. Sigmoid sinuses: Normal. Visualized jugular veins: Normal. IMPRESSION: 1. No acute intracranial abnormality. 2. Normal intracranial MRV. Electronically Signed   By: KUlyses JarredM.D.   On: 05/06/2020 19:53   DG Chest Portable 1 View  Result Date: 05/06/2020 CLINICAL DATA:  Hypoxia with chest pain. EXAM: PORTABLE CHEST 1 VIEW COMPARISON:  None. FINDINGS: 1200 hours. Low volume film. Cardiopericardial silhouette is at upper limits of normal for size. There is pulmonary vascular congestion without overt pulmonary edema. The visualized bony structures of the thorax show no acute abnormality. Telemetry leads overlie the chest. IMPRESSION: Low volume film with pulmonary vascular congestion. Electronically Signed   By: Misty Stanley M.D.   On: 05/06/2020 12:48   MR MRV HEAD W WO CONTRAST  Result Date: 05/06/2020 CLINICAL DATA:  Headache and encephalopathy EXAM: MRI HEAD WITHOUT AND WITH CONTRAST MRV HEAD WITHOUT AND WITH CONTRAST TECHNIQUE: Multiplanar, multiecho pulse sequences of the brain and surrounding structures were obtained without and with intravenous contrast. Angiographic images of the intracranial venous structures were obtained using MRV technique without and with  intravenous contrast. CONTRAST:  59m GADAVIST GADOBUTROL 1 MMOL/ML IV SOLN COMPARISON:  None. FINDINGS: MRI BRAIN FINDINGS Brain: No acute infarct, mass effect or extra-axial collection. No acute or chronic hemorrhage. Normal white matter signal, parenchymal volume and CSF spaces. The midline structures are normal. Vascular: Major flow voids are preserved. Skull and upper cervical spine: Normal calvarium and skull base. Visualized upper cervical spine and soft tissues are normal. Sinuses/Orbits:Sphenoid sinus mucosal thickening and opacification. Normal orbits. MRV BRAIN FINDINGS Superior sagittal sinus: Normal. Straight sinus: Normal. Inferior sagittal sinus, vein of Galen and internal cerebral veins: Normal. Transverse sinuses: Normal. Sigmoid sinuses: Normal. Visualized jugular veins: Normal. IMPRESSION: 1. No acute intracranial abnormality. 2. Normal intracranial MRV. Electronically Signed   By: KUlyses JarredM.D.   On: 05/06/2020 19:53        Scheduled Meds: . amoxicillin-clavulanate  1 tablet Oral BID  . aspirin EC  81 mg Oral Daily  . atorvastatin  40 mg Oral Daily  . [START ON 05/08/2020] predniSONE  10 mg Oral Q breakfast   Continuous Infusions: . sodium chloride 75 mL/hr at 05/07/20 0826  . heparin 950 Units/hr (05/07/20 0835)    Assessment & Plan:   Principal Problem:   Acute metabolic encephalopathy Active Problems:   Chest pain   Elevated troponin   Headache   Leukocytosis   Elevated blood pressure reading   Sphenoid sinusitis   Acute metabolic encephalopathy and headache: pt also has amnesia. Etiology is not clear.   Neurology consulted  Clinically has significantly improved today.  Patient has difficulty remembering events occurred.   MRI of brain negative.  CT of the head negative for acute abnormality. MRV shows no evidence for venous sinus thrombosis.  D-dimer normal. Differential diagnosis for HA per neurology an unusual presentation of migraine given his nausea.   Not typical for temporal arteritis and has no pain temporal region when palpated.  Severe tension type headache is a consideration. DDX for amnesia-consistent with transient global amnesia.  However subclinical seizure can in some cases present this way.  ESR normal. EEG planned for Monday Will need outpatient follow-up with neurology   Chest pain and elevated troponin: ddimer negative  Cards input was appreciated, ekg nonischemic. tp borderline for acs. Was started on heparin gtt, per cards can dc. F/u on echo -pending, if nml, will need stress test. UDS  positive for opiate(not sure if received some in ER)     Leukocytosis and sphenoid sinusitis: Leukocytosis with WBC 12.5 is at least partially due to steroid use.  Patient does not have fever clinically not septic -continue amox and prednisone home meds F/u bcx  Elevated  blood pressure reading: Blood pressure 172/101, 183/97.   Patient seems to have history of whitecoat syndrome 4/10-bp stable . Continue to monitor   DVT prophylaxis: lovenox, scd Code Status: Full Family Communication: None at bedside  Status is: Inpatient  Remains inpatient appropriate because:Inpatient level of care appropriate due to severity of illness   Dispo: The patient is from: Home              Anticipated d/c is to: Home              Patient currently is not medically stable to d/c.   Difficult to place patient No            LOS: 1 day   Time spent: 35 minutes with more than 50% on [place], MD [facility] Pager [phone]  If 7PM-7AM, please contact night-coverage 05/07/2020, 2:46 PM

## 2020-05-07 NOTE — Consult Note (Signed)
Cardiology Consultation Note    Patient ID: Marco Carpenter, MRN: [id number], DOB/AGE: 1955/07/04 65 y.o. Admit date: 05/06/2020   Date of Consult: 05/07/2020 Primary Physician: Dion Body, MD Primary Cardiologist: None  Chief Complaint: mental status changes Reason for Consultation: abnormal troponin Requesting MD: Dr. Kurtis Bushman  HPI: Marco Carpenter is a 65 y.o. male with history of tobacco abuse 30 years ago, but no apparent cardiac history who presented to the emergency room with altered mental status. He does not remember having chest pain and has relative amnesia regarding any events of yesterday.  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  Pt states he does not remember having chest pain and has some mild chest pain worsening with deep palpation.  In the er, he stated he felt badly and had a head ache. EMS reported chest pain but patient denied chest pain in the er. Complained of severe headache in the right frontal area above his right eye nonradiating.  No focal findings.  Brain MRI was unremarkable.  Brain CT showed severe chronic left sphenoid sinusitis.  There is additional disease in the left frontal bilateral ethmoid right sphenoid and left maxillary sinuses.  He had a troponin level of 25 on presentation with a white count of 12.5.  Covid negative.  EKG x2 showed normal sinus rhythm with no ischemia.  Serum troponin was trended and increased  to 239 however subsequent value was 97.  Patient was given heparin.  Renal function was normal.  Sed rate was normal.  White count 11.1.  Past Medical History:  Diagnosis Date  . Medical history non-contributory       Surgical History:  Past Surgical History:  Procedure Laterality Date  . COLONOSCOPY WITH PROPOFOL N/A 05/29/2015   Procedure: COLONOSCOPY WITH PROPOFOL;  Surgeon: Lollie Sails, MD;  Location: [facility] ENDOSCOPY;  Service: Endoscopy;  Laterality: N/A;  . NO PAST  SURGERIES       Home Meds: Prior to Admission medications   Medication Sig Start Date End Date Taking? Authorizing Provider  amoxicillin-clavulanate (AUGMENTIN) 875-125 MG tablet Take 1 tablet by mouth 2 (two) times daily. 05/01/20 05/11/20 Yes [provider]  predniSONE (STERAPRED UNI-PAK 21 TAB) 10 MG (21) TBPK tablet Take 10 mg by mouth See admin instructions. Take 6 tablets (60mg ) by mouth on day one and decrease by 1 tablet daily as directed until finished 05/01/20 05/08/20 Yes [provider]    Inpatient Medications:  . amoxicillin-clavulanate  1 tablet Oral BID  . aspirin EC  81 mg Oral Daily  . atorvastatin  40 mg Oral Daily  . heparin  4,000 Units Intravenous Once  . [START ON 05/08/2020] predniSONE  10 mg Oral Q breakfast   . sodium chloride 75 mL/hr at 05/07/20 0826  . heparin      Allergies: No Known Allergies  Social History   Socioeconomic History  . Marital status: Unknown    Spouse name: Not on file  . Number of children: Not on file  . Years of education: Not on file  . Highest education level: Not on file  Occupational History  . Not on file  Tobacco Use  . Smoking status: Former Smoker    Quit date: 05/29/1995    Years since quitting: 24.9  . Smokeless tobacco: Never Used  Vaping Use  . Vaping Use: Never used  Substance and Sexual Activity  . Alcohol use: No  . Drug use:  No  . Sexual activity: Not on file  Other Topics Concern  . Not on file  Social History Narrative  . Not on file   Social Determinants of Health   Financial Resource Strain: Not on file  Food Insecurity: Not on file  Transportation Needs: Not on file  Physical Activity: Not on file  Stress: Not on file  Social Connections: Not on file  Intimate Partner Violence: Not on file     Family History  Problem Relation Age of Onset  . Stroke Father      Review of Systems: A 12-system review of systems was performed and is negative except as noted in the  HPI.  Labs: No results for input(s): CKTOTAL, CKMB, TROPONINI in the last 72 hours. Lab Results  Component Value Date   WBC 11.1 (H) 05/07/2020   HGB 16.0 05/07/2020   HCT 45.7 05/07/2020   MCV 90.3 05/07/2020   PLT 294 05/07/2020    Recent Labs  Lab 05/06/20 1105 05/07/20 0531  NA 136 137  K 3.5 3.9  CL 104 103  CO2 22 25  BUN 19 17  CREATININE 1.02 0.96  CALCIUM 8.6* 8.6*  PROT 7.5  --   BILITOT 0.9  --   ALKPHOS 75  --   ALT 22  --   AST 22  --   GLUCOSE 132* 90   Lab Results  Component Value Date   CHOL 140 05/07/2020   HDL 29 (L) 05/07/2020   LDLCALC 87 05/07/2020   TRIG 121 05/07/2020   Lab Results  Component Value Date   DDIMER 0.48 05/06/2020    Radiology/Studies:  CT Head Wo Contrast  Result Date: 05/06/2020 CLINICAL DATA:  Altered mental status and headache EXAM: CT HEAD WITHOUT CONTRAST TECHNIQUE: Contiguous axial images were obtained from the base of the skull through the vertex without intravenous contrast. COMPARISON:  March 29, 2020 FINDINGS: Brain: Ventricles and sulci are normal in size and configuration. There is no intracranial mass, hemorrhage, extra-axial fluid collection, or midline shift. The brain parenchyma appears unremarkable. There is no appreciable acute infarct. Vascular: No hyperdense vessel.  No evident vascular calcification. Skull: The bony calvarium appears intact. Sinuses/Orbits: There is diffuse opacification of the left maxillary antrum. There is mild mucosal thickening in the right maxillary antrum. There is opacification in the visualized left maxillary antrum along the periphery as well as opacification in several ethmoid air cells. Orbits appear symmetric bilaterally. Other: Mastoid air cells are clear. IMPRESSION: Multifocal paranasal sinus disease.  Study otherwise unremarkable. Electronically Signed   By: Lowella Grip III M.D.   On: 05/06/2020 12:05   MR BRAIN W WO CONTRAST  Result Date: 05/06/2020 CLINICAL DATA:   Headache and encephalopathy EXAM: MRI HEAD WITHOUT AND WITH CONTRAST MRV HEAD WITHOUT AND WITH CONTRAST TECHNIQUE: Multiplanar, multiecho pulse sequences of the brain and surrounding structures were obtained without and with intravenous contrast. Angiographic images of the intracranial venous structures were obtained using MRV technique without and with intravenous contrast. CONTRAST:  23mL GADAVIST GADOBUTROL 1 MMOL/ML IV SOLN COMPARISON:  None. FINDINGS: MRI BRAIN FINDINGS Brain: No acute infarct, mass effect or extra-axial collection. No acute or chronic hemorrhage. Normal white matter signal, parenchymal volume and CSF spaces. The midline structures are normal. Vascular: Major flow voids are preserved. Skull and upper cervical spine: Normal calvarium and skull base. Visualized upper cervical spine and soft tissues are normal. Sinuses/Orbits:Sphenoid sinus mucosal thickening and opacification. Normal orbits. MRV BRAIN FINDINGS Superior sagittal sinus:  Normal. Straight sinus: Normal. Inferior sagittal sinus, vein of Galen and internal cerebral veins: Normal. Transverse sinuses: Normal. Sigmoid sinuses: Normal. Visualized jugular veins: Normal. IMPRESSION: 1. No acute intracranial abnormality. 2. Normal intracranial MRV. Electronically Signed   By: Ulyses Jarred M.D.   On: 05/06/2020 19:53   DG Chest Portable 1 View  Result Date: 05/06/2020 CLINICAL DATA:  Hypoxia with chest pain. EXAM: PORTABLE CHEST 1 VIEW COMPARISON:  None. FINDINGS: 1200 hours. Low volume film. Cardiopericardial silhouette is at upper limits of normal for size. There is pulmonary vascular congestion without overt pulmonary edema. The visualized bony structures of the thorax show no acute abnormality. Telemetry leads overlie the chest. IMPRESSION: Low volume film with pulmonary vascular congestion. Electronically Signed   By: Misty Stanley M.D.   On: 05/06/2020 12:48   MR MRV HEAD W WO CONTRAST  Result Date: 05/06/2020 CLINICAL DATA:   Headache and encephalopathy EXAM: MRI HEAD WITHOUT AND WITH CONTRAST MRV HEAD WITHOUT AND WITH CONTRAST TECHNIQUE: Multiplanar, multiecho pulse sequences of the brain and surrounding structures were obtained without and with intravenous contrast. Angiographic images of the intracranial venous structures were obtained using MRV technique without and with intravenous contrast. CONTRAST:  69mL GADAVIST GADOBUTROL 1 MMOL/ML IV SOLN COMPARISON:  None. FINDINGS: MRI BRAIN FINDINGS Brain: No acute infarct, mass effect or extra-axial collection. No acute or chronic hemorrhage. Normal white matter signal, parenchymal volume and CSF spaces. The midline structures are normal. Vascular: Major flow voids are preserved. Skull and upper cervical spine: Normal calvarium and skull base. Visualized upper cervical spine and soft tissues are normal. Sinuses/Orbits:Sphenoid sinus mucosal thickening and opacification. Normal orbits. MRV BRAIN FINDINGS Superior sagittal sinus: Normal. Straight sinus: Normal. Inferior sagittal sinus, vein of Galen and internal cerebral veins: Normal. Transverse sinuses: Normal. Sigmoid sinuses: Normal. Visualized jugular veins: Normal. IMPRESSION: 1. No acute intracranial abnormality. 2. Normal intracranial MRV. Electronically Signed   By: Ulyses Jarred M.D.   On: 05/06/2020 19:53    Wt Readings from Last 3 Encounters:  05/06/20 81.6 kg  05/29/15 88 kg    EKG: NSR with no ischemia  Physical Exam:  Blood pressure (!) 143/100, pulse 64, temperature 98.4 F (36.9 C), temperature source Oral, resp. rate 12, height 6' (1.829 m), weight 81.6 kg, SpO2 96 %. Body mass index is 24.41 kg/m. General: Well developed, well nourished, in no acute distress. Head: Normocephalic, atraumatic, sclera non-icteric, no xanthomas, nares are without discharge.  Neck: Negative for carotid bruits. JVD not elevated. Lungs: Clear bilaterally to auscultation without wheezes, rales, or rhonchi. Breathing is  unlabored. Heart: RRR with S1 S2. No murmurs, rubs, or gallops appreciated. Abdomen: Soft, non-tender, non-distended with normoactive bowel sounds. No hepatomegaly. No rebound/guarding. No obvious abdominal masses. Msk:  Strength and tone appear normal for age. Extremities: No clubbing or cyanosis. No edema.  Distal pedal pulses are 2+ and equal bilaterally. Neuro: Alert and oriented X 3. No facial asymmetry. No focal deficit. Moves all extremities spontaneously. Psych:  Responds to questions appropriately with a normal affect.     Assessment and Plan  65 y.o. male with history of tobacco abuse, but no apparent cardiac history who presented to the emergency room with altered mental status  and a headache.  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  Chest pain was mentioned by EMS but pt denies any recollection of having chest pain and denied in er.   Complained of severe headache.  No focal findings.  Brain MRI was unremarkable.  Brain CT showed severe chronic left sphenoid sinusitis.  There is additional disease in the left frontal bilateral ethmoid right sphenoid and left maxillary sinuses.  He had a troponin level of 25 on presentation with a white count of 12.5.  Covid negative.  EKG x2 showed normal sinus rhythm with no ischemia.  Serum troponin was trended and increased  to 239 however subsequent value was 97.  Patient was given heparin.  Renal function was normal.  Sed rate was normal.  White count 11.1.  1. Chest pain/abnormal troponin- Pt does not recal having chest pain and denied in er. Was reported by EMS. Troponin peaked at 239 and now back down to 97. EKG unremarkable for ischemia. Borderline for ACS. Was placed on heparin. No further chest pain. Risk factors for cad in include remote tobacco abuse and situational  Hypertension. He is on no medications. Very atypical picture for ACS. Will review echo when available and if no wall motion abnormality  proceed with a functional study in the morning.  If there is significant wall motion abnormality, such as no ischemia on a functional study or clinical change, would consider proceeding cardiac catheterization.  2. Headache-has had severe headaches since head trauma approximately 1-2 months ago.  Also has severe left-sided sinusitis  may be playing a role in his headache..  Now states the headache is on the left.  Signed, Teodoro Spray MD 05/07/2020, 8:30 AM Pager: [phone]

## 2020-05-07 NOTE — Progress Notes (Signed)
Subjective: Significant improvement today. States he has no difficulty remembering events occurring today, but cannot recall the events of yesterday.   Objective: Current vital signs: BP (!) 143/100 (BP Location: Right Wrist)   Pulse 64   Temp 98.4 F (36.9 C) (Oral)   Resp 12   Ht 6' (1.829 m)   Wt 81.6 kg   SpO2 96%   BMI 24.41 kg/m  Vital signs in last 24 hours: Temp:  [97.5 F (36.4 C)-98.8 F (37.1 C)] 98.4 F (36.9 C) (04/10 0745) Pulse Rate:  [54-66] 64 (04/10 0745) Resp:  [12-26] 12 (04/10 0745) BP: (113-186)/(80-116) 143/100 (04/10 0745) SpO2:  [95 %-100 %] 96 % (04/10 0745) Weight:  [81.6 kg] 81.6 kg (04/09 1113)  Intake/Output from previous day: 04/09 0701 - 04/10 0700 In: 120.9 [P.O.:120; IV Piggyback:0.9] Out: -  Intake/Output this shift: Total I/O In: 480 [P.O.:480] Out: -  Nutritional status:  Diet Order            Diet Heart Room service appropriate? Yes; Fluid consistency: Thin  Diet effective now                HEENT: [place]/AT. No scleral injection.  Lungs: Respirations unlabored.  Ext: No edema  Neurologic Exam: Ment: Alert and oriented to situation, city, state, day of the week, year and month. Speech fluent with intact comprehension and naming. Able to recall today's events. Able to recite the months of the year forwards without difficulty. Has some difficulty reciting the months of the year backwards, but this is significantly improved relative to yesterday and son states is consistent with the patient's baseline functioning. Memory testing reveals intact registration of 3 items (table, apple, penny) but difficulty recalling 1 of the items after a 30 second delay; after rehearsing the 3 items 2 more times, the patient was able to recall all 3 after a longer delay of 3 minutes.  CN: Fixates and tracks normally. EOMI. Face symmetric.  Motor: 5/5 x 4.  Sensory: Intact to FT.  Cerebellar: Normal FNF bilaterally.   Lab Results: Results for orders  placed or performed during the hospital encounter of 05/06/20 (from the past 48 hour(s))  Comprehensive metabolic panel     Status: Abnormal   Collection Time: 05/06/20 11:05 AM  Result Value Ref Range   Sodium 136 135 - 145 mmol/L   Potassium 3.5 3.5 - 5.1 mmol/L   Chloride 104 98 - 111 mmol/L   CO2 22 22 - 32 mmol/L   Glucose, Bld 132 (H) 70 - 99 mg/dL    Comment: Glucose reference range applies only to samples taken after fasting for at least 8 hours.   BUN 19 8 - 23 mg/dL   Creatinine, Ser 1.02 0.61 - 1.24 mg/dL   Calcium 8.6 (L) 8.9 - 10.3 mg/dL   Total Protein 7.5 6.5 - 8.1 g/dL   Albumin 4.4 3.5 - 5.0 g/dL   AST 22 15 - 41 U/L   ALT 22 0 - 44 U/L   Alkaline Phosphatase 75 38 - 126 U/L   Total Bilirubin 0.9 0.3 - 1.2 mg/dL   GFR, Estimated >60 >60 mL/min    Comment: (NOTE) Calculated using the CKD-EPI Creatinine Equation (2021)    Anion gap 10 5 - 15    Comment: Performed at [facility], [address], [place], [place] [address]  CBC with Differential/Platelet     Status: Abnormal   Collection Time: 05/06/20 11:05 AM  Result Value Ref Range  WBC 12.5 (H) 4.0 - 10.5 K/uL   RBC 5.54 4.22 - 5.81 MIL/uL   Hemoglobin 16.9 13.0 - 17.0 g/dL   HCT 49.4 39.0 - 52.0 %   MCV 89.2 80.0 - 100.0 fL   MCH 30.5 26.0 - 34.0 pg   MCHC 34.2 30.0 - 36.0 g/dL   RDW 12.9 11.5 - 15.5 %   Platelets 345 150 - 400 K/uL   nRBC 0.0 0.0 - 0.2 %   Neutrophils Relative % 74 %   Neutro Abs 9.2 (H) 1.7 - 7.7 K/uL   Lymphocytes Relative 17 %   Lymphs Abs 2.1 0.7 - 4.0 K/uL   Monocytes Relative 7 %   Monocytes Absolute 0.9 0.1 - 1.0 K/uL   Eosinophils Relative 0 %   Eosinophils Absolute 0.0 0.0 - 0.5 K/uL   Basophils Relative 0 %   Basophils Absolute 0.1 0.0 - 0.1 K/uL   Immature Granulocytes 2 %   Abs Immature Granulocytes 0.29 (H) 0.00 - 0.07 K/uL    Comment: Performed at [facility], [place], [place] [address]  Troponin I (High Sensitivity)     Status:  Abnormal   Collection Time: 05/06/20 11:05 AM  Result Value Ref Range   Troponin I (High Sensitivity) 25 (H) <18 ng/L    Comment: (NOTE) Elevated high sensitivity troponin I (hsTnI) values and significant  changes across serial measurements may suggest ACS but many other  chronic and acute conditions are known to elevate hsTnI results.  Refer to the "Links" section for chest pain algorithms and additional  guidance. Performed at [facility], [address] [place], [place] [address]   D-dimer, quantitative     Status: None   Collection Time: 05/06/20 11:05 AM  Result Value Ref Range   D-Dimer, Quant 0.48 0.00 - 0.50 ug/mL-FEU    Comment: (NOTE) At the manufacturer cut-off value of 0.5 g/mL FEU, this assay has a negative predictive value of 95-100%.This assay is intended for use in conjunction with a clinical pretest probability (PTP) assessment model to exclude pulmonary embolism (PE) and deep venous thrombosis (DVT) in outpatients suspected of PE or DVT. Results should be correlated with clinical presentation. Performed at [facility], [address] [place], [place] [address]   Resp Panel by RT-PCR (Flu A&B, Covid) Nasopharyngeal Swab     Status: None   Collection Time: 05/06/20 12:51 PM   Specimen: Nasopharyngeal Swab; Nasopharyngeal(NP) swabs in vial transport medium  Result Value Ref Range   SARS Coronavirus 2 by RT PCR NEGATIVE NEGATIVE    Comment: (NOTE) SARS-CoV-2 target nucleic acids are NOT DETECTED.  The SARS-CoV-2 RNA is generally detectable in upper respiratory specimens during the acute phase of infection. The lowest concentration of SARS-CoV-2 viral copies this assay can detect is 138 copies/mL. A negative result does not preclude SARS-Cov-2 infection and should not be used as the sole basis for treatment or other patient management decisions. A negative result may occur with  improper specimen collection/handling, submission of  specimen other than nasopharyngeal swab, presence of viral mutation(s) within the areas targeted by this assay, and inadequate number of viral copies(<138 copies/mL). A negative result must be combined with clinical observations, patient history, and epidemiological information. The expected result is Negative.  Fact Sheet for Patients:  EntrepreneurPulse.com.au  Fact Sheet for Healthcare Providers:  IncredibleEmployment.be  This test is no t yet approved or cleared by the [place] FDA and  has been authorized for detection and/or  diagnosis of SARS-CoV-2 by FDA under an Emergency Use Authorization (EUA). This EUA will remain  in effect (meaning this test can be used) for the duration of the COVID-19 declaration under Section 564(b)(1) of the Act, 21 U.S.C.section 360bbb-3(b)(1), unless the authorization is terminated  or revoked sooner.       Influenza A by PCR NEGATIVE NEGATIVE   Influenza B by PCR NEGATIVE NEGATIVE    Comment: (NOTE) The Xpert Xpress SARS-CoV-2/FLU/RSV plus assay is intended as an aid in the diagnosis of influenza from Nasopharyngeal swab specimens and should not be used as a sole basis for treatment. Nasal washings and aspirates are unacceptable for Xpert Xpress SARS-CoV-2/FLU/RSV testing.  Fact Sheet for Patients: EntrepreneurPulse.com.au  Fact Sheet for Healthcare Providers: IncredibleEmployment.be  This test is not yet approved or cleared by the [place] FDA and has been authorized for detection and/or diagnosis of SARS-CoV-2 by FDA under an Emergency Use Authorization (EUA). This EUA will remain in effect (meaning this test can be used) for the duration of the COVID-19 declaration under Section 564(b)(1) of the Act, 21 U.S.C. section 360bbb-3(b)(1), unless the authorization is terminated or revoked.  Performed at [facility], [address]  [place], [place] [address]   CULTURE, BLOOD (ROUTINE X 2) w Reflex to ID Panel     Status: None (Preliminary result)   Collection Time: 05/06/20  2:45 PM   Specimen: BLOOD  Result Value Ref Range   Specimen Description BLOOD BLOOD RIGHT HAND    Special Requests      BOTTLES DRAWN AEROBIC AND ANAEROBIC Blood Culture adequate volume   Culture      NO GROWTH < 12 HOURS Performed at [facility], [address], [place], [place] [address]    Report Status PENDING   CULTURE, BLOOD (ROUTINE X 2) w Reflex to ID Panel     Status: None (Preliminary result)   Collection Time: 05/06/20  2:45 PM   Specimen: BLOOD  Result Value Ref Range   Specimen Description BLOOD BLOOD LEFT HAND    Special Requests      BOTTLES DRAWN AEROBIC AND ANAEROBIC Blood Culture adequate volume   Culture      NO GROWTH < 12 HOURS Performed at [facility], [address] [place], [place] [address]    Report Status PENDING   Troponin I (High Sensitivity)     Status: Abnormal   Collection Time: 05/06/20  2:45 PM  Result Value Ref Range   Troponin I (High Sensitivity) 144 (HH) <18 ng/L    Comment: CRITICAL VALUE NOTED. VALUE IS CONSISTENT WITH PREVIOUSLY REPORTED/CALLED VALUE  ADL (NOTE) Elevated high sensitivity troponin I (hsTnI) values and significant  changes across serial measurements may suggest ACS but many other  chronic and acute conditions are known to elevate hsTnI results.  Refer to the "Links" section for chest pain algorithms and additional  guidance. Performed at [facility], [address] [place], [place] [address]   HIV Antibody (routine testing w rflx)     Status: None   Collection Time: 05/06/20  4:09 PM  Result Value Ref Range   HIV Screen 4th Generation wRfx Non Reactive Non Reactive    Comment: Performed at [facility], [place] [address] [place], [place] [address]  Troponin I (High Sensitivity)     Status: Abnormal   Collection Time: 05/06/20  4:09 PM   Result Value Ref Range   Troponin I (High Sensitivity) 176 (HH) <18 ng/L  Comment: CRITICAL RESULT CALLED TO, READ BACK BY AND VERIFIED WITH JENNIFER STEPHENS AT 1727 05/06/20.PMF (NOTE) Elevated high sensitivity troponin I (hsTnI) values and significant  changes across serial measurements may suggest ACS but many other  chronic and acute conditions are known to elevate hsTnI results.  Refer to the "Links" section for chest pain algorithms and additional  guidance. Performed at [facility], [address], [place], [place] [address]   Urine Drug Screen, Qualitative [facility] only)     Status: Abnormal   Collection Time: 05/06/20  5:00 PM  Result Value Ref Range   Tricyclic, Ur Screen NONE DETECTED NONE DETECTED   Amphetamines, Ur Screen NONE DETECTED NONE DETECTED   MDMA (Ecstasy)Ur Screen NONE DETECTED NONE DETECTED   Cocaine Metabolite,Ur [place] NONE DETECTED NONE DETECTED   Opiate, Ur Screen POSITIVE (A) NONE DETECTED   Phencyclidine (PCP) Ur S NONE DETECTED NONE DETECTED   Cannabinoid 50 Ng, Ur [place] NONE DETECTED NONE DETECTED   Barbiturates, Ur Screen NONE DETECTED NONE DETECTED   Benzodiazepine, Ur Scrn NONE DETECTED NONE DETECTED   Methadone Scn, Ur NONE DETECTED NONE DETECTED    Comment: (NOTE) Tricyclics + metabolites, urine    Cutoff 1000 ng/mL Amphetamines + metabolites, urine  Cutoff 1000 ng/mL MDMA (Ecstasy), urine              Cutoff 500 ng/mL Cocaine Metabolite, urine          Cutoff 300 ng/mL Opiate + metabolites, urine        Cutoff 300 ng/mL Phencyclidine (PCP), urine         Cutoff 25 ng/mL Cannabinoid, urine                 Cutoff 50 ng/mL Barbiturates + metabolites, urine  Cutoff 200 ng/mL Benzodiazepine, urine              Cutoff 200 ng/mL Methadone, urine                   Cutoff 300 ng/mL  The urine drug screen provides only a preliminary, unconfirmed analytical test result and should not be used for non-medical purposes. Clinical consideration and  professional judgment should be applied to any positive drug screen result due to possible interfering substances. A more specific alternate chemical method must be used in order to obtain a confirmed analytical result. Gas chromatography / mass spectrometry (GC/MS) is the preferred confirm atory method. Performed at [facility], [address] [place], [place] [address]   Urinalysis, Complete w Microscopic Urine, Random     Status: Abnormal   Collection Time: 05/06/20  5:00 PM  Result Value Ref Range   Color, Urine YELLOW (A) YELLOW   APPearance CLEAR (A) CLEAR   Specific Gravity, Urine 1.019 1.005 - 1.030   pH 6.0 5.0 - 8.0   Glucose, UA NEGATIVE NEGATIVE mg/dL   Hgb urine dipstick SMALL (A) NEGATIVE   Bilirubin Urine NEGATIVE NEGATIVE   Ketones, ur 5 (A) NEGATIVE mg/dL   Protein, ur NEGATIVE NEGATIVE mg/dL   Nitrite NEGATIVE NEGATIVE   Leukocytes,Ua NEGATIVE NEGATIVE   RBC / HPF 6-10 0 - 5 RBC/hpf   WBC, UA 0-5 0 - 5 WBC/hpf   Bacteria, UA NONE SEEN NONE SEEN   Squamous Epithelial / LPF 0-5 0 - 5   Mucus PRESENT     Comment: Performed at [facility], [address] [place], [place] [address]  Troponin I (High Sensitivity)  Status: Abnormal   Collection Time: 05/06/20  8:16 PM  Result Value Ref Range   Troponin I (High Sensitivity) 239 (HH) <18 ng/L    Comment: CRITICAL VALUE NOTED. VALUE IS CONSISTENT WITH PREVIOUSLY REPORTED/CALLED VALUE  ADL (NOTE) Elevated high sensitivity troponin I (hsTnI) values and significant  changes across serial measurements may suggest ACS but many other  chronic and acute conditions are known to elevate hsTnI results.  Refer to the "Links" section for chest pain algorithms and additional  guidance. Performed at [facility], [address] [place], [place] [address]   Sedimentation rate     Status: None   Collection Time: 05/06/20  8:16 PM  Result Value Ref Range   Sed Rate 1 0 - 20 mm/hr     Comment: Performed at [facility], [address] [place], [place] [address]  Lipid panel     Status: Abnormal   Collection Time: 05/07/20  5:31 AM  Result Value Ref Range   Cholesterol 140 0 - 200 mg/dL   Triglycerides 121 <150 mg/dL   HDL 29 (L) >40 mg/dL   Total CHOL/HDL Ratio 4.8 RATIO   VLDL 24 0 - 40 mg/dL   LDL Cholesterol 87 0 - 99 mg/dL    Comment:        Total Cholesterol/HDL:CHD Risk Coronary Heart Disease Risk Table                     Men   Women  1/2 Average Risk   3.4   3.3  Average Risk       5.0   4.4  2 X Average Risk   9.6   7.1  3 X Average Risk  23.4   11.0        Use the calculated Patient Ratio above and the CHD Risk Table to determine the patient's CHD Risk.        ATP III CLASSIFICATION (LDL):  <100     mg/dL   Optimal  100-129  mg/dL   Near or Above                    Optimal  130-159  mg/dL   Borderline  160-189  mg/dL   High  >190     mg/dL   Very High Performed at [facility], [address] [place], [place] [address]   CBC     Status: Abnormal   Collection Time: 05/07/20  5:31 AM  Result Value Ref Range   WBC 11.1 (H) 4.0 - 10.5 K/uL   RBC 5.06 4.22 - 5.81 MIL/uL   Hemoglobin 16.0 13.0 - 17.0 g/dL   HCT 45.7 39.0 - 52.0 %   MCV 90.3 80.0 - 100.0 fL   MCH 31.6 26.0 - 34.0 pg   MCHC 35.0 30.0 - 36.0 g/dL   RDW 13.3 11.5 - 15.5 %   Platelets 294 150 - 400 K/uL   nRBC 0.0 0.0 - 0.2 %    Comment: Performed at [facility], [address], [place], [place] [address]  Basic metabolic panel     Status: Abnormal   Collection Time: 05/07/20  5:31 AM  Result Value Ref Range   Sodium 137 135 - 145 mmol/L   Potassium 3.9 3.5 - 5.1 mmol/L   Chloride 103 98 - 111 mmol/L   CO2 25 22 - 32 mmol/L   Glucose, Bld 90 70 - 99 mg/dL    Comment:  Glucose reference range applies only to samples taken after fasting for at least 8 hours.   BUN 17 8 - 23 mg/dL   Creatinine, Ser 0.96 0.61 - 1.24 mg/dL   Calcium 8.6 (L) 8.9 -  10.3 mg/dL   GFR, Estimated >60 >60 mL/min    Comment: (NOTE) Calculated using the CKD-EPI Creatinine Equation (2021)    Anion gap 9 5 - 15    Comment: Performed at [facility], [place], [place] [address]  Troponin I (High Sensitivity)     Status: Abnormal   Collection Time: 05/07/20  7:16 AM  Result Value Ref Range   Troponin I (High Sensitivity) 97 (H) <18 ng/L    Comment: (NOTE) Elevated high sensitivity troponin I (hsTnI) values and significant  changes across serial measurements may suggest ACS but many other  chronic and acute conditions are known to elevate hsTnI results.  Refer to the "Links" section for chest pain algorithms and additional  guidance. Performed at [facility], [address] [place], [place] [address]   APTT     Status: None   Collection Time: 05/07/20  7:28 AM  Result Value Ref Range   aPTT 31 24 - 36 seconds    Comment: Performed at [facility], [address] [place], [place] [address]  Protime-INR     Status: None   Collection Time: 05/07/20  7:28 AM  Result Value Ref Range   Prothrombin Time 13.5 11.4 - 15.2 seconds   INR 1.1 0.8 - 1.2    Comment: (NOTE) INR goal varies based on device and disease states. Performed at [facility], [place], [place] [address]   Troponin I (High Sensitivity)     Status: Abnormal   Collection Time: 05/07/20  9:46 AM  Result Value Ref Range   Troponin I (High Sensitivity) 84 (H) <18 ng/L    Comment: (NOTE) Elevated high sensitivity troponin I (hsTnI) values and significant  changes across serial measurements may suggest ACS but many other  chronic and acute conditions are known to elevate hsTnI results.  Refer to the "Links" section for chest pain algorithms and additional  guidance. Performed at [facility], [address], [place], [place] [address]     Recent Results (from the past 240 hour(s))  Resp Panel by RT-PCR  (Flu A&B, Covid) Nasopharyngeal Swab     Status: None   Collection Time: 05/06/20 12:51 PM   Specimen: Nasopharyngeal Swab; Nasopharyngeal(NP) swabs in vial transport medium  Result Value Ref Range Status   SARS Coronavirus 2 by RT PCR NEGATIVE NEGATIVE Final    Comment: (NOTE) SARS-CoV-2 target nucleic acids are NOT DETECTED.  The SARS-CoV-2 RNA is generally detectable in upper respiratory specimens during the acute phase of infection. The lowest concentration of SARS-CoV-2 viral copies this assay can detect is 138 copies/mL. A negative result does not preclude SARS-Cov-2 infection and should not be used as the sole basis for treatment or other patient management decisions. A negative result may occur with  improper specimen collection/handling, submission of specimen other than nasopharyngeal swab, presence of viral mutation(s) within the areas targeted by this assay, and inadequate number of viral copies(<138 copies/mL). A negative result must be combined with clinical observations, patient history, and epidemiological information. The expected result is Negative.  Fact Sheet for Patients:  EntrepreneurPulse.com.au  Fact Sheet for Healthcare Providers:  IncredibleEmployment.be  This test is no t yet approved or cleared by the [place]  States FDA and  has been authorized for detection and/or diagnosis of SARS-CoV-2 by FDA under an Emergency Use Authorization (EUA). This EUA will remain  in effect (meaning this test can be used) for the duration of the COVID-19 declaration under Section 564(b)(1) of the Act, 21 U.S.C.section 360bbb-3(b)(1), unless the authorization is terminated  or revoked sooner.       Influenza A by PCR NEGATIVE NEGATIVE Final   Influenza B by PCR NEGATIVE NEGATIVE Final    Comment: (NOTE) The Xpert Xpress SARS-CoV-2/FLU/RSV plus assay is intended as an aid in the diagnosis of influenza from Nasopharyngeal swab specimens  and should not be used as a sole basis for treatment. Nasal washings and aspirates are unacceptable for Xpert Xpress SARS-CoV-2/FLU/RSV testing.  Fact Sheet for Patients: EntrepreneurPulse.com.au  Fact Sheet for Healthcare Providers: IncredibleEmployment.be  This test is not yet approved or cleared by the [place] FDA and has been authorized for detection and/or diagnosis of SARS-CoV-2 by FDA under an Emergency Use Authorization (EUA). This EUA will remain in effect (meaning this test can be used) for the duration of the COVID-19 declaration under Section 564(b)(1) of the Act, 21 U.S.C. section 360bbb-3(b)(1), unless the authorization is terminated or revoked.  Performed at [facility], [address] [place], [place] [address]   CULTURE, BLOOD (ROUTINE X 2) w Reflex to ID Panel     Status: None (Preliminary result)   Collection Time: 05/06/20  2:45 PM   Specimen: BLOOD  Result Value Ref Range Status   Specimen Description BLOOD BLOOD RIGHT HAND  Final   Special Requests   Final    BOTTLES DRAWN AEROBIC AND ANAEROBIC Blood Culture adequate volume   Culture   Final    NO GROWTH < 12 HOURS Performed at [facility], [address] [place], [place] [address]    Report Status PENDING  Incomplete  CULTURE, BLOOD (ROUTINE X 2) w Reflex to ID Panel     Status: None (Preliminary result)   Collection Time: 05/06/20  2:45 PM   Specimen: BLOOD  Result Value Ref Range Status   Specimen Description BLOOD BLOOD LEFT HAND  Final   Special Requests   Final    BOTTLES DRAWN AEROBIC AND ANAEROBIC Blood Culture adequate volume   Culture   Final    NO GROWTH < 12 HOURS Performed at [facility], [place], [place] [address]    Report Status PENDING  Incomplete    Lipid Panel Recent Labs    05/07/20 0531  CHOL 140  TRIG 121  HDL 29*  CHOLHDL 4.8  VLDL 24  LDLCALC 87    Studies/Results: CT  Head Wo Contrast  Result Date: 05/06/2020 CLINICAL DATA:  Altered mental status and headache EXAM: CT HEAD WITHOUT CONTRAST TECHNIQUE: Contiguous axial images were obtained from the base of the skull through the vertex without intravenous contrast. COMPARISON:  March 29, 2020 FINDINGS: Brain: Ventricles and sulci are normal in size and configuration. There is no intracranial mass, hemorrhage, extra-axial fluid collection, or midline shift. The brain parenchyma appears unremarkable. There is no appreciable acute infarct. Vascular: No hyperdense vessel.  No evident vascular calcification. Skull: The bony calvarium appears intact. Sinuses/Orbits: There is diffuse opacification of the left maxillary antrum. There is mild mucosal thickening in the right maxillary antrum. There is opacification in the visualized left maxillary antrum along the periphery as well as opacification in several ethmoid air cells. Orbits appear symmetric bilaterally. Other: Mastoid air  cells are clear. IMPRESSION: Multifocal paranasal sinus disease.  Study otherwise unremarkable. Electronically Signed   By: Lowella Grip III M.D.   On: 05/06/2020 12:05   MR BRAIN W WO CONTRAST  Result Date: 05/06/2020 CLINICAL DATA:  Headache and encephalopathy EXAM: MRI HEAD WITHOUT AND WITH CONTRAST MRV HEAD WITHOUT AND WITH CONTRAST TECHNIQUE: Multiplanar, multiecho pulse sequences of the brain and surrounding structures were obtained without and with intravenous contrast. Angiographic images of the intracranial venous structures were obtained using MRV technique without and with intravenous contrast. CONTRAST:  46m GADAVIST GADOBUTROL 1 MMOL/ML IV SOLN COMPARISON:  None. FINDINGS: MRI BRAIN FINDINGS Brain: No acute infarct, mass effect or extra-axial collection. No acute or chronic hemorrhage. Normal white matter signal, parenchymal volume and CSF spaces. The midline structures are normal. Vascular: Major flow voids are preserved. Skull and upper  cervical spine: Normal calvarium and skull base. Visualized upper cervical spine and soft tissues are normal. Sinuses/Orbits:Sphenoid sinus mucosal thickening and opacification. Normal orbits. MRV BRAIN FINDINGS Superior sagittal sinus: Normal. Straight sinus: Normal. Inferior sagittal sinus, vein of Galen and internal cerebral veins: Normal. Transverse sinuses: Normal. Sigmoid sinuses: Normal. Visualized jugular veins: Normal. IMPRESSION: 1. No acute intracranial abnormality. 2. Normal intracranial MRV. Electronically Signed   By: KUlyses JarredM.D.   On: 05/06/2020 19:53   DG Chest Portable 1 View  Result Date: 05/06/2020 CLINICAL DATA:  Hypoxia with chest pain. EXAM: PORTABLE CHEST 1 VIEW COMPARISON:  None. FINDINGS: 1200 hours. Low volume film. Cardiopericardial silhouette is at upper limits of normal for size. There is pulmonary vascular congestion without overt pulmonary edema. The visualized bony structures of the thorax show no acute abnormality. Telemetry leads overlie the chest. IMPRESSION: Low volume film with pulmonary vascular congestion. Electronically Signed   By: EMisty StanleyM.D.   On: 05/06/2020 12:48   MR MRV HEAD W WO CONTRAST  Result Date: 05/06/2020 CLINICAL DATA:  Headache and encephalopathy EXAM: MRI HEAD WITHOUT AND WITH CONTRAST MRV HEAD WITHOUT AND WITH CONTRAST TECHNIQUE: Multiplanar, multiecho pulse sequences of the brain and surrounding structures were obtained without and with intravenous contrast. Angiographic images of the intracranial venous structures were obtained using MRV technique without and with intravenous contrast. CONTRAST:  811mGADAVIST GADOBUTROL 1 MMOL/ML IV SOLN COMPARISON:  None. FINDINGS: MRI BRAIN FINDINGS Brain: No acute infarct, mass effect or extra-axial collection. No acute or chronic hemorrhage. Normal white matter signal, parenchymal volume and CSF spaces. The midline structures are normal. Vascular: Major flow voids are preserved. Skull and upper  cervical spine: Normal calvarium and skull base. Visualized upper cervical spine and soft tissues are normal. Sinuses/Orbits:Sphenoid sinus mucosal thickening and opacification. Normal orbits. MRV BRAIN FINDINGS Superior sagittal sinus: Normal. Straight sinus: Normal. Inferior sagittal sinus, vein of Galen and internal cerebral veins: Normal. Transverse sinuses: Normal. Sigmoid sinuses: Normal. Visualized jugular veins: Normal. IMPRESSION: 1. No acute intracranial abnormality. 2. Normal intracranial MRV. Electronically Signed   By: KeUlyses Jarred.D.   On: 05/06/2020 19:53    Medications:  Scheduled: . amoxicillin-clavulanate  1 tablet Oral BID  . aspirin EC  81 mg Oral Daily  . atorvastatin  40 mg Oral Daily  . [START ON 05/08/2020] predniSONE  10 mg Oral Q breakfast   Continuous: . sodium chloride 75 mL/hr at 05/07/20 0826  . heparin 950 Units/hr (05/07/20 0835)    Assessment; [age]ear old male presenting with severe headache, chest pain and amnesia 1. Exam on admission yesterday revealed severe anterograde amnesia. Today's exam  shows significant improvement in his memory and concentration, which are back to baseline per family's observation of the Neurological exam.  2. MRV shows no evidence for venous sinus thrombosis. D-dimer is normal.  3. MRI appearance of the brain is normal. Sphenoid sinus mucosal thickening and opacification is noted. 4. Leukocytosis improved to 11.1 today.  5. Troponin I peaked at 239 overnight, now down to 84. EKG in the ED showed NSR 6. DDx for headache: May be an unusual presentation of migraine given his nausea. Not typical for temporal arteritis and there is no pain to temporal regions when palpated. Given that massage to temporalis muscles improves his headache somewhat, a severe tension type headache is a consideration. No neck stiffness or fever to militate in favor of a meningitis. Hemorrhage and cerebral venous sinus thrombosis have been ruled out by imaging.   7. DDx for amnesia: Overall presentation is most consistent with transient global amnesia (TGA). However, subclinical seizure can in some cases present this way.  8. ESR normal.   Recommendations: 1. EEG has been ordered. Will be performed on Monday.  2. Recommend that primary team call Dr. Sharol Roussel to clarify the reason for the discrepancy in his clinic note between the diagnosis of fungal sinusitis and the medication prescribed (amoxicillin-clavulanate), which is an antibacterial agent.  3. Will need outpatient Neurology follow up.    LOS: 1 day   '@Electronically'  signed: Dr. Kerney Elbe 05/07/2020  11:01 AM

## 2020-05-08 ENCOUNTER — Inpatient Hospital Stay: Payer: Medicare Other

## 2020-05-08 DIAGNOSIS — R079 Chest pain, unspecified: Secondary | ICD-10-CM | POA: Diagnosis not present

## 2020-05-08 DIAGNOSIS — J323 Chronic sphenoidal sinusitis: Secondary | ICD-10-CM | POA: Diagnosis not present

## 2020-05-08 DIAGNOSIS — G9341 Metabolic encephalopathy: Secondary | ICD-10-CM | POA: Diagnosis not present

## 2020-05-08 LAB — CBC
HCT: 45.8 % (ref 39.0–52.0)
Hemoglobin: 16.1 g/dL (ref 13.0–17.0)
MCH: 31.7 pg (ref 26.0–34.0)
MCHC: 35.2 g/dL (ref 30.0–36.0)
MCV: 90.2 fL (ref 80.0–100.0)
Platelets: 275 10*3/uL (ref 150–400)
RBC: 5.08 MIL/uL (ref 4.22–5.81)
RDW: 13.2 % (ref 11.5–15.5)
WBC: 9.7 10*3/uL (ref 4.0–10.5)
nRBC: 0 % (ref 0.0–0.2)

## 2020-05-08 LAB — NM MYOCAR MULTI W/SPECT W/WALL MOTION / EF
Estimated workload: 1 METS
Exercise duration (min): 1 min
Exercise duration (sec): 4 s
LV dias vol: 65 mL (ref 62–150)
LV sys vol: 26 mL
MPHR: 155 {beats}/min
Peak HR: 90 {beats}/min
Percent HR: 58 %
Rest HR: 57 {beats}/min
SDS: 0
SRS: 1
SSS: 1
TID: 0.91

## 2020-05-08 MED ORDER — REGADENOSON 0.4 MG/5ML IV SOLN
0.4000 mg | Freq: Once | INTRAVENOUS | Status: AC
  Administered 2020-05-08: 0.4 mg via INTRAVENOUS

## 2020-05-08 MED ORDER — TECHNETIUM TC 99M TETROFOSMIN IV KIT
31.6940 | PACK | Freq: Once | INTRAVENOUS | Status: AC | PRN
  Administered 2020-05-08: 31.694 via INTRAVENOUS

## 2020-05-08 MED ORDER — TECHNETIUM TC 99M TETROFOSMIN IV KIT
10.0000 | PACK | Freq: Once | INTRAVENOUS | Status: AC | PRN
  Administered 2020-05-08: 10.093 via INTRAVENOUS

## 2020-05-08 NOTE — Progress Notes (Signed)
Patient Name: Marco Carpenter Date of Encounter: 05/08/2020  Hospital Problem List     Principal Problem:   Acute metabolic encephalopathy Active Problems:   Chest pain   Elevated troponin   Headache   Leukocytosis   Elevated blood pressure reading   Sphenoid sinusitis    Patient Profile      65 y.o. male with history of tobacco abuse 30 years ago, but no apparent cardiac history who presented to the emergency room with altered mental status. He does not remember having chest pain and has relative amnesia regarding any events of yesterday.  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  Pt states he does not remember having chest pain and has some mild chest pain worsening with deep palpation.  In the er, he stated he felt badly and had a head ache. EMS reported chest pain but patient denied chest pain in the er. Complained of severe headache in the right frontal area above his right eye nonradiating.  No focal findings.  Brain MRI was unremarkable.  Brain CT showed severe chronic left sphenoid sinusitis.  There is additional disease in the left frontal bilateral ethmoid right sphenoid and left maxillary sinuses.  He had a troponin level of 25 on presentation with a white count of 12.5.  Covid negative.  EKG x2 showed normal sinus rhythm with no ischemia.  Serum troponin was trended and increased  to 239 however subsequent value was 97.   Subjective   Pain-free, headache improved.  Inpatient Medications    . amoxicillin-clavulanate  1 tablet Oral BID  . aspirin EC  81 mg Oral Daily  . atorvastatin  40 mg Oral Daily  . enoxaparin (LOVENOX) injection  40 mg Subcutaneous Q24H    Vital Signs    Vitals:   05/07/20 1950 05/08/20 0431 05/08/20 0617 05/08/20 0747  BP: 114/76  112/76 108/73  Pulse: 86  (!) 55 (!) 55  Resp: 16  15 17   Temp: 98.5 F (36.9 C)  98.8 F (37.1 C) 98.1 F (36.7 C)  TempSrc: Oral  Oral Oral  SpO2: 97%   96% 95%  Weight:  89.1 kg    Height:        Intake/Output Summary (Last 24 hours) at 05/08/2020 1703 Last data filed at 05/07/2020 1830 Gross per 24 hour  Intake 981.51 ml  Output --  Net 981.51 ml   Filed Weights   05/06/20 1113 05/08/20 0431  Weight: 81.6 kg 89.1 kg    Physical Exam    GEN: Well nourished, well developed, in no acute distress.  HEENT: normal.  Neck: Supple, no JVD, carotid bruits, or masses. Cardiac: RRR, no murmurs, rubs, or gallops. No clubbing, cyanosis, edema.  Radials/DP/PT 2+ and equal bilaterally.  Respiratory:  Respirations regular and unlabored, clear to auscultation bilaterally. GI: Soft, nontender, nondistended, BS + x 4. MS: no deformity or atrophy. Skin: warm and dry, no rash. Neuro:  Strength and sensation are intact. Psych: Normal affect.  Labs    CBC Recent Labs    05/06/20 1105 05/07/20 0531 05/08/20 0324  WBC 12.5* 11.1* 9.7  NEUTROABS 9.2*  --   --   HGB 16.9 16.0 16.1  HCT 49.4 45.7 45.8  MCV 89.2 90.3 90.2  PLT 345 294 989   Basic Metabolic Panel Recent Labs    05/06/20 1105 05/07/20 0531  NA 136 137  K 3.5 3.9  CL 104 103  CO2 22  25  GLUCOSE 132* 90  BUN 19 17  CREATININE 1.02 0.96  CALCIUM 8.6* 8.6*   Liver Function Tests Recent Labs    05/06/20 1105  AST 22  ALT 22  ALKPHOS 75  BILITOT 0.9  PROT 7.5  ALBUMIN 4.4   No results for input(s): LIPASE, AMYLASE in the last 72 hours. Cardiac Enzymes No results for input(s): CKTOTAL, CKMB, CKMBINDEX, TROPONINI in the last 72 hours. BNP No results for input(s): BNP in the last 72 hours. D-Dimer Recent Labs    05/06/20 1105  DDIMER 0.48   Hemoglobin A1C Recent Labs    05/07/20 0531  HGBA1C 5.4   Fasting Lipid Panel Recent Labs    05/07/20 0531  CHOL 140  HDL 29*  LDLCALC 87  TRIG 121  CHOLHDL 4.8   Thyroid Function Tests No results for input(s): TSH, T4TOTAL, T3FREE, THYROIDAB in the last 72 hours.  Invalid input(s): FREET3  Telemetry     Normal sinus rhythm  ECG    No ischemia  Radiology    CT Head Wo Contrast  Result Date: 05/06/2020 CLINICAL DATA:  Altered mental status and headache EXAM: CT HEAD WITHOUT CONTRAST TECHNIQUE: Contiguous axial images were obtained from the base of the skull through the vertex without intravenous contrast. COMPARISON:  March 29, 2020 FINDINGS: Brain: Ventricles and sulci are normal in size and configuration. There is no intracranial mass, hemorrhage, extra-axial fluid collection, or midline shift. The brain parenchyma appears unremarkable. There is no appreciable acute infarct. Vascular: No hyperdense vessel.  No evident vascular calcification. Skull: The bony calvarium appears intact. Sinuses/Orbits: There is diffuse opacification of the left maxillary antrum. There is mild mucosal thickening in the right maxillary antrum. There is opacification in the visualized left maxillary antrum along the periphery as well as opacification in several ethmoid air cells. Orbits appear symmetric bilaterally. Other: Mastoid air cells are clear. IMPRESSION: Multifocal paranasal sinus disease.  Study otherwise unremarkable. Electronically Signed   By: Lowella Grip III M.D.   On: 05/06/2020 12:05   MR BRAIN W WO CONTRAST  Result Date: 05/06/2020 CLINICAL DATA:  Headache and encephalopathy EXAM: MRI HEAD WITHOUT AND WITH CONTRAST MRV HEAD WITHOUT AND WITH CONTRAST TECHNIQUE: Multiplanar, multiecho pulse sequences of the brain and surrounding structures were obtained without and with intravenous contrast. Angiographic images of the intracranial venous structures were obtained using MRV technique without and with intravenous contrast. CONTRAST:  97mL GADAVIST GADOBUTROL 1 MMOL/ML IV SOLN COMPARISON:  None. FINDINGS: MRI BRAIN FINDINGS Brain: No acute infarct, mass effect or extra-axial collection. No acute or chronic hemorrhage. Normal white matter signal, parenchymal volume and CSF spaces. The midline structures are  normal. Vascular: Major flow voids are preserved. Skull and upper cervical spine: Normal calvarium and skull base. Visualized upper cervical spine and soft tissues are normal. Sinuses/Orbits:Sphenoid sinus mucosal thickening and opacification. Normal orbits. MRV BRAIN FINDINGS Superior sagittal sinus: Normal. Straight sinus: Normal. Inferior sagittal sinus, vein of Galen and internal cerebral veins: Normal. Transverse sinuses: Normal. Sigmoid sinuses: Normal. Visualized jugular veins: Normal. IMPRESSION: 1. No acute intracranial abnormality. 2. Normal intracranial MRV. Electronically Signed   By: Ulyses Jarred M.D.   On: 05/06/2020 19:53   NM Myocar Multi W/Spect W/Wall Motion / EF  Result Date: 05/08/2020  Blood pressure demonstrated a normal response to exercise.  There was no ST segment deviation noted during stress.  The study is normal.  This is a low risk study.  The left ventricular ejection fraction is normal (  55-65%).    DG Chest Portable 1 View  Result Date: 05/06/2020 CLINICAL DATA:  Hypoxia with chest pain. EXAM: PORTABLE CHEST 1 VIEW COMPARISON:  None. FINDINGS: 1200 hours. Low volume film. Cardiopericardial silhouette is at upper limits of normal for size. There is pulmonary vascular congestion without overt pulmonary edema. The visualized bony structures of the thorax show no acute abnormality. Telemetry leads overlie the chest. IMPRESSION: Low volume film with pulmonary vascular congestion. Electronically Signed   By: Misty Stanley M.D.   On: 05/06/2020 12:48   MR MRV HEAD W WO CONTRAST  Result Date: 05/06/2020 CLINICAL DATA:  Headache and encephalopathy EXAM: MRI HEAD WITHOUT AND WITH CONTRAST MRV HEAD WITHOUT AND WITH CONTRAST TECHNIQUE: Multiplanar, multiecho pulse sequences of the brain and surrounding structures were obtained without and with intravenous contrast. Angiographic images of the intracranial venous structures were obtained using MRV technique without and with  intravenous contrast. CONTRAST:  26mL GADAVIST GADOBUTROL 1 MMOL/ML IV SOLN COMPARISON:  None. FINDINGS: MRI BRAIN FINDINGS Brain: No acute infarct, mass effect or extra-axial collection. No acute or chronic hemorrhage. Normal white matter signal, parenchymal volume and CSF spaces. The midline structures are normal. Vascular: Major flow voids are preserved. Skull and upper cervical spine: Normal calvarium and skull base. Visualized upper cervical spine and soft tissues are normal. Sinuses/Orbits:Sphenoid sinus mucosal thickening and opacification. Normal orbits. MRV BRAIN FINDINGS Superior sagittal sinus: Normal. Straight sinus: Normal. Inferior sagittal sinus, vein of Galen and internal cerebral veins: Normal. Transverse sinuses: Normal. Sigmoid sinuses: Normal. Visualized jugular veins: Normal. IMPRESSION: 1. No acute intracranial abnormality. 2. Normal intracranial MRV. Electronically Signed   By: Ulyses Jarred M.D.   On: 05/06/2020 19:53    Assessment & Plan    65 y.o.malewith history oftobacco abuse, but no apparent cardiac history who presented to the emergency room with altered mental status  and a headache. 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 Chest pain was mentioned by EMS but pt denies any recollection of having chest pain and denied in er.  Complained of severe headache. No focal findings. Brain MRI was unremarkable. Brain CT showed severe chronic left sphenoid sinusitis. There is additional disease in the left frontal bilateral ethmoid right sphenoid and left maxillary sinuses. He had a troponin level of 25 on presentation with a white count of 12.5. Covid negative. EKG x2 showed normal sinus rhythm with no ischemia. Serum troponin was trended and increased to 239 however subsequent value was 97. Patient was given heparin. Renal function was normal. Sed rate was normal. White count 11.1.  1. Chest pain/abnormal troponin- Pt does not  recal having chest pain and denied in er. Was reported by EMS. Troponin peaked at 239 and now back down to 97. EKG unremarkable for ischemia. Borderline for ACS. Was placed on heparin. No further chest pain. Risk factors for cad in include remote tobacco abuse and situational  Hypertension. He is on no medications. Very atypical picture for ACS. Functional study negative for ischemia. No further cardiac workup indicated. OK for discharge from cardiac standpoint  2. Headache-has had severe headaches since head trauma approximately 1-2 months ago.  Also has severe left-sided sinusitis  may be playing a role in his headache..  Now states the headache is on the left.  Signed, Javier Docker Kynzee Devinney MD 05/08/2020, 5:03 PM  Pager: [phone]

## 2020-05-08 NOTE — Plan of Care (Signed)

## 2020-05-08 NOTE — Progress Notes (Signed)
PROGRESS NOTE    Marco Carpenter  YBO:[id number] DOB: 10-06-1955 DOA: 05/06/2020 PCP: Dion Body, MD   Assessment & Plan:   Principal Problem:   Acute metabolic encephalopathy Active Problems:   Chest pain   Elevated troponin   Headache   Leukocytosis   Elevated blood pressure reading   Sphenoid sinusitis   Acute metabolic encephalopathy: etiology unclear. MRI brain & CT head shows no acute intracranial abnormalities. MRV shows no evidence of venous sinus thrombosis. EEG today. Neuro following and recs apprec   Headache: possible severe tension headache. Tylenol prn   Chest pain: w/ elevated troponin. IV heparin was d/c as per cardio. Continue on tele. Echo pending. Cardiac stress test today as well   Sphenoid sinusitis: continue on augmentin & steroids       DVT prophylaxis: lovenox  Code Status: full  Family Communication: discussed pt's care with pt's family at bedside and answered their questions  Disposition Plan: likely d/c back home  Level of care: Progressive Cardiac   Status is: Inpatient  Remains inpatient appropriate because:Ongoing diagnostic testing needed not appropriate for outpatient work up and Inpatient level of care appropriate due to severity of illness   Dispo: The patient is from: Home              Anticipated d/c is to: Home              Patient currently is not medically stable to d/c.   Difficult to place patient No        Consultants:   Neuro  Cardio    Procedures:   Antimicrobials: augmentin    Subjective: Pt c/o fatigue   Objective: Vitals:   05/07/20 1950 05/08/20 0431 05/08/20 0617 05/08/20 0747  BP: 114/76  112/76 108/73  Pulse: 86  (!) 55 (!) 55  Resp: 16  15 17   Temp: 98.5 F (36.9 C)  98.8 F (37.1 C) 98.1 F (36.7 C)  TempSrc: Oral  Oral Oral  SpO2: 97%  96% 95%  Weight:  89.1 kg    Height:        Intake/Output Summary (Last 24 hours) at 05/08/2020 0816 Last data filed at 05/07/2020  1830 Gross per 24 hour  Intake 2181.51 ml  Output --  Net 2181.51 ml   Filed Weights   05/06/20 1113 05/08/20 0431  Weight: 81.6 kg 89.1 kg    Examination:  General exam: Appears calm and comfortable  Respiratory system: Clear to auscultation. Respiratory effort normal. Cardiovascular system: S1 & S2 +. No rubs, gallops or clicks.  Gastrointestinal system: Abdomen is nondistended, soft and nontender. Normal bowel sounds heard. Central nervous system: Alert and oriented. Moves all 4 extremities  Psychiatry: Judgement and insight appear normal. Mood & affect appropriate.     Data Reviewed: I have personally reviewed following labs and imaging studies  CBC: Recent Labs  Lab 05/06/20 1105 05/07/20 0531 05/08/20 0324  WBC 12.5* 11.1* 9.7  NEUTROABS 9.2*  --   --   HGB 16.9 16.0 16.1  HCT 49.4 45.7 45.8  MCV 89.2 90.3 90.2  PLT 345 294 277   Basic Metabolic Panel: Recent Labs  Lab 05/06/20 1105 05/07/20 0531  NA 136 137  K 3.5 3.9  CL 104 103  CO2 22 25  GLUCOSE 132* 90  BUN 19 17  CREATININE 1.02 0.96  CALCIUM 8.6* 8.6*   GFR: Estimated Creatinine Clearance: 84.2 mL/min (by C-G formula based on SCr of 0.96 mg/dL). Liver Function Tests:  Recent Labs  Lab 05/06/20 1105  AST 22  ALT 22  ALKPHOS 75  BILITOT 0.9  PROT 7.5  ALBUMIN 4.4   No results for input(s): LIPASE, AMYLASE in the last 168 hours. No results for input(s): AMMONIA in the last 168 hours. Coagulation Profile: Recent Labs  Lab 05/07/20 0728  INR 1.1   Cardiac Enzymes: No results for input(s): CKTOTAL, CKMB, CKMBINDEX, TROPONINI in the last 168 hours. BNP (last 3 results) No results for input(s): PROBNP in the last 8760 hours. HbA1C: Recent Labs    05/07/20 0531  HGBA1C 5.4   CBG: No results for input(s): GLUCAP in the last 168 hours. Lipid Profile: Recent Labs    05/07/20 0531  CHOL 140  HDL 29*  LDLCALC 87  TRIG 121  CHOLHDL 4.8   Thyroid Function Tests: No results  for input(s): TSH, T4TOTAL, FREET4, T3FREE, THYROIDAB in the last 72 hours. Anemia Panel: No results for input(s): VITAMINB12, FOLATE, FERRITIN, TIBC, IRON, RETICCTPCT in the last 72 hours. Sepsis Labs: No results for input(s): PROCALCITON, LATICACIDVEN in the last 168 hours.  Recent Results (from the past 240 hour(s))  Resp Panel by RT-PCR (Flu A&B, Covid) Nasopharyngeal Swab     Status: None   Collection Time: 05/06/20 12:51 PM   Specimen: Nasopharyngeal Swab; Nasopharyngeal(NP) swabs in vial transport medium  Result Value Ref Range Status   SARS Coronavirus 2 by RT PCR NEGATIVE NEGATIVE Final    Comment: (NOTE) SARS-CoV-2 target nucleic acids are NOT DETECTED.  The SARS-CoV-2 RNA is generally detectable in upper respiratory specimens during the acute phase of infection. The lowest concentration of SARS-CoV-2 viral copies this assay can detect is 138 copies/mL. A negative result does not preclude SARS-Cov-2 infection and should not be used as the sole basis for treatment or other patient management decisions. A negative result may occur with  improper specimen collection/handling, submission of specimen other than nasopharyngeal swab, presence of viral mutation(s) within the areas targeted by this assay, and inadequate number of viral copies(<138 copies/mL). A negative result must be combined with clinical observations, patient history, and epidemiological information. The expected result is Negative.  Fact Sheet for Patients:  EntrepreneurPulse.com.au  Fact Sheet for Healthcare Providers:  IncredibleEmployment.be  This test is no t yet approved or cleared by the [place] FDA and  has been authorized for detection and/or diagnosis of SARS-CoV-2 by FDA under an Emergency Use Authorization (EUA). This EUA will remain  in effect (meaning this test can be used) for the duration of the COVID-19 declaration under Section 564(b)(1) of the Act,  21 U.S.C.section 360bbb-3(b)(1), unless the authorization is terminated  or revoked sooner.       Influenza A by PCR NEGATIVE NEGATIVE Final   Influenza B by PCR NEGATIVE NEGATIVE Final    Comment: (NOTE) The Xpert Xpress SARS-CoV-2/FLU/RSV plus assay is intended as an aid in the diagnosis of influenza from Nasopharyngeal swab specimens and should not be used as a sole basis for treatment. Nasal washings and aspirates are unacceptable for Xpert Xpress SARS-CoV-2/FLU/RSV testing.  Fact Sheet for Patients: EntrepreneurPulse.com.au  Fact Sheet for Healthcare Providers: IncredibleEmployment.be  This test is not yet approved or cleared by the [place] FDA and has been authorized for detection and/or diagnosis of SARS-CoV-2 by FDA under an Emergency Use Authorization (EUA). This EUA will remain in effect (meaning this test can be used) for the duration of the COVID-19 declaration under Section 564(b)(1) of the Act, 21 U.S.C. section 360bbb-3(b)(1), unless the  authorization is terminated or revoked.  Performed at [facility], [address] [place], [place] [address]   CULTURE, BLOOD (ROUTINE X 2) w Reflex to ID Panel     Status: None (Preliminary result)   Collection Time: 05/06/20  2:45 PM   Specimen: BLOOD  Result Value Ref Range Status   Specimen Description BLOOD BLOOD RIGHT HAND  Final   Special Requests   Final    BOTTLES DRAWN AEROBIC AND ANAEROBIC Blood Culture adequate volume   Culture   Final    NO GROWTH 2 DAYS Performed at [facility], [address], [place], [place] [address]    Report Status PENDING  Incomplete  CULTURE, BLOOD (ROUTINE X 2) w Reflex to ID Panel     Status: None (Preliminary result)   Collection Time: 05/06/20  2:45 PM   Specimen: BLOOD  Result Value Ref Range Status   Specimen Description BLOOD BLOOD LEFT HAND  Final   Special Requests   Final    BOTTLES DRAWN AEROBIC AND  ANAEROBIC Blood Culture adequate volume   Culture   Final    NO GROWTH 2 DAYS Performed at [facility], [address] [place], [place] [address]    Report Status PENDING  Incomplete         Radiology Studies: CT Head Wo Contrast  Result Date: 05/06/2020 CLINICAL DATA:  Altered mental status and headache EXAM: CT HEAD WITHOUT CONTRAST TECHNIQUE: Contiguous axial images were obtained from the base of the skull through the vertex without intravenous contrast. COMPARISON:  March 29, 2020 FINDINGS: Brain: Ventricles and sulci are normal in size and configuration. There is no intracranial mass, hemorrhage, extra-axial fluid collection, or midline shift. The brain parenchyma appears unremarkable. There is no appreciable acute infarct. Vascular: No hyperdense vessel.  No evident vascular calcification. Skull: The bony calvarium appears intact. Sinuses/Orbits: There is diffuse opacification of the left maxillary antrum. There is mild mucosal thickening in the right maxillary antrum. There is opacification in the visualized left maxillary antrum along the periphery as well as opacification in several ethmoid air cells. Orbits appear symmetric bilaterally. Other: Mastoid air cells are clear. IMPRESSION: Multifocal paranasal sinus disease.  Study otherwise unremarkable. Electronically Signed   By: Lowella Grip III M.D.   On: 05/06/2020 12:05   MR BRAIN W WO CONTRAST  Result Date: 05/06/2020 CLINICAL DATA:  Headache and encephalopathy EXAM: MRI HEAD WITHOUT AND WITH CONTRAST MRV HEAD WITHOUT AND WITH CONTRAST TECHNIQUE: Multiplanar, multiecho pulse sequences of the brain and surrounding structures were obtained without and with intravenous contrast. Angiographic images of the intracranial venous structures were obtained using MRV technique without and with intravenous contrast. CONTRAST:  48mL GADAVIST GADOBUTROL 1 MMOL/ML IV SOLN COMPARISON:  None. FINDINGS: MRI BRAIN FINDINGS Brain: No acute  infarct, mass effect or extra-axial collection. No acute or chronic hemorrhage. Normal white matter signal, parenchymal volume and CSF spaces. The midline structures are normal. Vascular: Major flow voids are preserved. Skull and upper cervical spine: Normal calvarium and skull base. Visualized upper cervical spine and soft tissues are normal. Sinuses/Orbits:Sphenoid sinus mucosal thickening and opacification. Normal orbits. MRV BRAIN FINDINGS Superior sagittal sinus: Normal. Straight sinus: Normal. Inferior sagittal sinus, vein of Galen and internal cerebral veins: Normal. Transverse sinuses: Normal. Sigmoid sinuses: Normal. Visualized jugular veins: Normal. IMPRESSION: 1. No acute intracranial abnormality. 2. Normal intracranial MRV. Electronically Signed   By: Ulyses Jarred M.D.   On: 05/06/2020 19:53   DG Chest Portable 1  View  Result Date: 05/06/2020 CLINICAL DATA:  Hypoxia with chest pain. EXAM: PORTABLE CHEST 1 VIEW COMPARISON:  None. FINDINGS: 1200 hours. Low volume film. Cardiopericardial silhouette is at upper limits of normal for size. There is pulmonary vascular congestion without overt pulmonary edema. The visualized bony structures of the thorax show no acute abnormality. Telemetry leads overlie the chest. IMPRESSION: Low volume film with pulmonary vascular congestion. Electronically Signed   By: Misty Stanley M.D.   On: 05/06/2020 12:48   MR MRV HEAD W WO CONTRAST  Result Date: 05/06/2020 CLINICAL DATA:  Headache and encephalopathy EXAM: MRI HEAD WITHOUT AND WITH CONTRAST MRV HEAD WITHOUT AND WITH CONTRAST TECHNIQUE: Multiplanar, multiecho pulse sequences of the brain and surrounding structures were obtained without and with intravenous contrast. Angiographic images of the intracranial venous structures were obtained using MRV technique without and with intravenous contrast. CONTRAST:  63mL GADAVIST GADOBUTROL 1 MMOL/ML IV SOLN COMPARISON:  None. FINDINGS: MRI BRAIN FINDINGS Brain: No acute  infarct, mass effect or extra-axial collection. No acute or chronic hemorrhage. Normal white matter signal, parenchymal volume and CSF spaces. The midline structures are normal. Vascular: Major flow voids are preserved. Skull and upper cervical spine: Normal calvarium and skull base. Visualized upper cervical spine and soft tissues are normal. Sinuses/Orbits:Sphenoid sinus mucosal thickening and opacification. Normal orbits. MRV BRAIN FINDINGS Superior sagittal sinus: Normal. Straight sinus: Normal. Inferior sagittal sinus, vein of Galen and internal cerebral veins: Normal. Transverse sinuses: Normal. Sigmoid sinuses: Normal. Visualized jugular veins: Normal. IMPRESSION: 1. No acute intracranial abnormality. 2. Normal intracranial MRV. Electronically Signed   By: Ulyses Jarred M.D.   On: 05/06/2020 19:53        Scheduled Meds: . amoxicillin-clavulanate  1 tablet Oral BID  . aspirin EC  81 mg Oral Daily  . atorvastatin  40 mg Oral Daily  . enoxaparin (LOVENOX) injection  40 mg Subcutaneous Q24H  . predniSONE  10 mg Oral Q breakfast   Continuous Infusions:   LOS: 2 days    Time spent: 32 mins    Wyvonnia Dusky, MD [facility] Pager [phone]  If 7PM-7AM, please contact night-coverage 05/08/2020, 8:16 AM

## 2020-05-09 DIAGNOSIS — G9341 Metabolic encephalopathy: Secondary | ICD-10-CM | POA: Diagnosis not present

## 2020-05-09 DIAGNOSIS — R079 Chest pain, unspecified: Secondary | ICD-10-CM | POA: Diagnosis not present

## 2020-05-09 LAB — CBC
HCT: 48.4 % (ref 39.0–52.0)
Hemoglobin: 16.5 g/dL (ref 13.0–17.0)
MCH: 30.8 pg (ref 26.0–34.0)
MCHC: 34.1 g/dL (ref 30.0–36.0)
MCV: 90.3 fL (ref 80.0–100.0)
Platelets: 284 10*3/uL (ref 150–400)
RBC: 5.36 MIL/uL (ref 4.22–5.81)
RDW: 13.3 % (ref 11.5–15.5)
WBC: 9.1 10*3/uL (ref 4.0–10.5)
nRBC: 0 % (ref 0.0–0.2)

## 2020-05-09 LAB — BASIC METABOLIC PANEL
Anion gap: 8 (ref 5–15)
BUN: 21 mg/dL (ref 8–23)
CO2: 24 mmol/L (ref 22–32)
Calcium: 8.7 mg/dL — ABNORMAL LOW (ref 8.9–10.3)
Chloride: 108 mmol/L (ref 98–111)
Creatinine, Ser: 1.14 mg/dL (ref 0.61–1.24)
GFR, Estimated: >60 mL/min (ref >60)
Glucose, Bld: 105 mg/dL — ABNORMAL HIGH (ref 70–99)
Potassium: 4.4 mmol/L (ref 3.5–5.1)
Sodium: 140 mmol/L (ref 135–145)

## 2020-05-09 MED ORDER — ASPIRIN 81 MG PO TBEC: 81.0000 mg | DELAYED_RELEASE_TABLET | Freq: Every day | ORAL | Status: DC

## 2020-05-09 MED ORDER — ATORVASTATIN CALCIUM 40 MG PO TABS: 40.0000 mg | ORAL_TABLET | Freq: Every day | ORAL | 0 refills | Status: DC

## 2020-05-09 NOTE — Plan of Care (Signed)

## 2020-05-09 NOTE — Discharge Summary (Signed)
Physician Discharge Summary  Marco Carpenter EQA:[id number] DOB: 1956-01-15 DOA: 05/06/2020  PCP: Dion Body, MD  Admit date: 05/06/2020 Discharge date: 05/09/2020  Admitted From: home  Disposition: home   Recommendations for Outpatient Follow-up:  1. Follow up with PCP in 1-2 weeks 2. F/u w/ neuro, Dr. Melrose Nakayama, in 1 week & needs an outpatient EEG 3. No driving until cleared by neurology   Home Health: no  Equipment/Devices:   Discharge Condition: stable  CODE STATUS: full  Diet recommendation: regular   Brief/Interim Summary: HPI was taken from Dr. Blaine Hamper: Marco Carpenter is a 65 y.o. male with medical history significant of former smoker, chronic sphenoid sinusitis, who presents with altered mental status and chest pain, and HA and amnesia.  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 His chest pain was located in substernal area, mild, associated with shortness breath.  Patient does not have cough, fever or chills.  Patient had 1 episode of oxygen desaturation to 86% on room air when he was being transferred for CT scan. His oxygen saturation then improved to 95% on room air.  Currently his chest pain has subsided.  Patient complains of severe headache, which is located in the right frontal area, above right eye, constant, 10 out of 10 severity, nonradiating.  Associated with blurry vision.  Patient does not have unilateral weakness, no facial droop or slurred speech.  Patient is also confused and has amnesia.  Of note, patient has chronic left sphenoid sinusitis.  He was given prescription of amoxicillin and prednisone by ENT on 05/01/2020.  Patient does not have nausea vomiting, diarrhea or abdominal pain no symptoms of UTI.  CT-head on 03/29/20 showed Severe chronic left sphenoid sinusitis with complete opacification  of the sinus and expansion of the left  sphenoid sinus ostium.  Additional disease within the left frontal, bilateral ethmoid, right  sphenoid and left maxillary sinuses as described. Consider ENT  consultation.   ED Course: pt was found to have WBC 12.5, troponin level 25, negative Covid PCR, negative D-dimer 0.48, electrolytes renal function okay, temperature normal, blood pressure 172/101, heart rate 54, RR 24, 16, oxygen saturation 95% on room air currently.  Chest x-ray showed low volume and vascular congestion.  Patient is placed on progressive bed as inpatient.  Dr. Cheral Marker of neurology, Dr. Ubaldo Glassing of cardiology are consulted.   CT-head showed: Multifocal paranasal sinus disease. Study otherwise unremarkable.  Hospital course from Dr. Jimmye Norman 4/11-4/12/22: Pt presented w/ altered mental status of unknown etiology. CT head & MRI brain was neg for any acute intracranial abnormalities. EEG was ordered but unable to be completed inpatient so pt will have an outpatient EEG and will f/u w/ neuro. Pt should not drive until cleared by neurology. Pt verbalized his understanding. Of note, pt c/o chest pain and was found to have elevated troponins. Cardiac stress test was done and was neg for ischemia as per cardio. For more information, please see previous progress/consult notes.   Discharge Diagnoses:  Principal Problem:   Acute metabolic encephalopathy Active Problems:   Chest pain   Elevated troponin   Headache   Leukocytosis   Elevated blood pressure reading   Sphenoid sinusitis Acute metabolic encephalopathy: etiology unclear. MRI brain & CT head shows no acute intracranial abnormalities. MRV shows no evidence of venous sinus thrombosis. EEG as an outpatient.  No driving until cleared  by neuro. Neuro following and recs apprec   Headache: possible severe tension headache. Tylenol prn   Chest pain: w/ elevated troponin. IV heparin was d/c as per cardio. Continue on tele. Echo . Cardiac stress test was neg for ischemia as per  cardio   Sphenoid sinusitis: continue on augmentin & steroids. F/u outpatient w/ ENT    Discharge Instructions  Discharge Instructions    Diet - low sodium heart healthy   Complete by: As directed    Discharge instructions   Complete by: As directed    F/u w/ neuro, Dr. Melrose Nakayama, in 1 week. Will need an EEG as an outpatient. No more driving until you are cleared by the neurologist. F/u w/ PCP in 1-2 weeks   Increase activity slowly   Complete by: As directed      Allergies as of 05/09/2020   No Known Allergies     Medication List    STOP taking these medications   predniSONE 10 MG (21) Tbpk tablet Commonly known as: STERAPRED UNI-PAK 21 TAB     TAKE these medications   amoxicillin-clavulanate 875-125 MG tablet Commonly known as: AUGMENTIN Take 1 tablet by mouth 2 (two) times daily.   aspirin 81 MG EC tablet Take 1 tablet (81 mg total) by mouth daily. Swallow whole. Start taking on: May 10, 2020   atorvastatin 40 MG tablet Commonly known as: Lipitor Take 1 tablet (40 mg total) by mouth daily.       Follow-up Information    Corey Skains, MD On 05/18/2020.   Specialty: Cardiology Why: @ 2:30pm Contact information: [address] [facility] [place] [address] [phone]        Anabel Bene, MD In 1 week.   Specialty: Neurology Why: Needs an outpatient EEG  Contact information: [facility] [place] [address] [phone]              No Known Allergies  Consultations:  Cardio  Neuro    Procedures/Studies: CT Head Wo Contrast  Result Date: 05/06/2020 CLINICAL DATA:  Altered mental status and headache EXAM: CT HEAD WITHOUT CONTRAST TECHNIQUE: Contiguous axial images were obtained from the base of the skull through the vertex without intravenous contrast. COMPARISON:  March 29, 2020 FINDINGS: Brain: Ventricles and sulci are normal in size and configuration.  There is no intracranial mass, hemorrhage, extra-axial fluid collection, or midline shift. The brain parenchyma appears unremarkable. There is no appreciable acute infarct. Vascular: No hyperdense vessel.  No evident vascular calcification. Skull: The bony calvarium appears intact. Sinuses/Orbits: There is diffuse opacification of the left maxillary antrum. There is mild mucosal thickening in the right maxillary antrum. There is opacification in the visualized left maxillary antrum along the periphery as well as opacification in several ethmoid air cells. Orbits appear symmetric bilaterally. Other: Mastoid air cells are clear. IMPRESSION: Multifocal paranasal sinus disease.  Study otherwise unremarkable. Electronically Signed   By: Lowella Grip III M.D.   On: 05/06/2020 12:05   MR BRAIN W WO CONTRAST  Result Date: 05/06/2020 CLINICAL DATA:  Headache and encephalopathy EXAM: MRI HEAD WITHOUT AND WITH CONTRAST MRV HEAD WITHOUT AND WITH CONTRAST TECHNIQUE: Multiplanar, multiecho pulse sequences of the brain and surrounding structures were obtained without and with intravenous contrast. Angiographic images of the intracranial venous structures were obtained using MRV technique without and with intravenous contrast. CONTRAST:  53mL GADAVIST GADOBUTROL 1 MMOL/ML IV SOLN COMPARISON:  None. FINDINGS: MRI BRAIN FINDINGS Brain:  No acute infarct, mass effect or extra-axial collection. No acute or chronic hemorrhage. Normal white matter signal, parenchymal volume and CSF spaces. The midline structures are normal. Vascular: Major flow voids are preserved. Skull and upper cervical spine: Normal calvarium and skull base. Visualized upper cervical spine and soft tissues are normal. Sinuses/Orbits:Sphenoid sinus mucosal thickening and opacification. Normal orbits. MRV BRAIN FINDINGS Superior sagittal sinus: Normal. Straight sinus: Normal. Inferior sagittal sinus, vein of Galen and internal cerebral veins: Normal. Transverse  sinuses: Normal. Sigmoid sinuses: Normal. Visualized jugular veins: Normal. IMPRESSION: 1. No acute intracranial abnormality. 2. Normal intracranial MRV. Electronically Signed   By: Ulyses Jarred M.D.   On: 05/06/2020 19:53   NM Myocar Multi W/Spect W/Wall Motion / EF  Result Date: 05/08/2020  Blood pressure demonstrated a normal response to exercise.  There was no ST segment deviation noted during stress.  The study is normal.  This is a low risk study.  The left ventricular ejection fraction is normal (55-65%).    DG Chest Portable 1 View  Result Date: 05/06/2020 CLINICAL DATA:  Hypoxia with chest pain. EXAM: PORTABLE CHEST 1 VIEW COMPARISON:  None. FINDINGS: 1200 hours. Low volume film. Cardiopericardial silhouette is at upper limits of normal for size. There is pulmonary vascular congestion without overt pulmonary edema. The visualized bony structures of the thorax show no acute abnormality. Telemetry leads overlie the chest. IMPRESSION: Low volume film with pulmonary vascular congestion. Electronically Signed   By: Misty Stanley M.D.   On: 05/06/2020 12:48   MR MRV HEAD W WO CONTRAST  Result Date: 05/06/2020 CLINICAL DATA:  Headache and encephalopathy EXAM: MRI HEAD WITHOUT AND WITH CONTRAST MRV HEAD WITHOUT AND WITH CONTRAST TECHNIQUE: Multiplanar, multiecho pulse sequences of the brain and surrounding structures were obtained without and with intravenous contrast. Angiographic images of the intracranial venous structures were obtained using MRV technique without and with intravenous contrast. CONTRAST:  80mL GADAVIST GADOBUTROL 1 MMOL/ML IV SOLN COMPARISON:  None. FINDINGS: MRI BRAIN FINDINGS Brain: No acute infarct, mass effect or extra-axial collection. No acute or chronic hemorrhage. Normal white matter signal, parenchymal volume and CSF spaces. The midline structures are normal. Vascular: Major flow voids are preserved. Skull and upper cervical spine: Normal calvarium and skull base.  Visualized upper cervical spine and soft tissues are normal. Sinuses/Orbits:Sphenoid sinus mucosal thickening and opacification. Normal orbits. MRV BRAIN FINDINGS Superior sagittal sinus: Normal. Straight sinus: Normal. Inferior sagittal sinus, vein of Galen and internal cerebral veins: Normal. Transverse sinuses: Normal. Sigmoid sinuses: Normal. Visualized jugular veins: Normal. IMPRESSION: 1. No acute intracranial abnormality. 2. Normal intracranial MRV. Electronically Signed   By: Ulyses Jarred M.D.   On: 05/06/2020 19:53        Subjective: Pt c/o fatigue    Discharge Exam: Vitals:   05/09/20 0439 05/09/20 0845  BP: 115/71 128/90  Pulse: 78 60  Resp: 16 16  Temp: 97.9 F (36.6 C) 97.6 F (36.4 C)  SpO2: 98% 100%   Vitals:   05/08/20 0747 05/08/20 2042 05/09/20 0439 05/09/20 0845  BP: 108/73 126/78 115/71 128/90  Pulse: (!) 55 77 78 60  Resp: 17 18 16 16   Temp: 98.1 F (36.7 C) 99.6 F (37.6 C) 97.9 F (36.6 C) 97.6 F (36.4 C)  TempSrc: Oral Oral Oral Oral  SpO2: 95% 95% 98% 100%  Weight:      Height:        General: Pt is alert, awake, not in acute distress Cardiovascular: S1/S2 +, no rubs, no  gallops Respiratory: CTA bilaterally, no wheezing, no rhonchi Abdominal: Soft, NT, ND, bowel sounds + Extremities: no edema, no cyanosis    The results of significant diagnostics from this hospitalization (including imaging, microbiology, ancillary and laboratory) are listed below for reference.     Microbiology: Recent Results (from the past 240 hour(s))  Resp Panel by RT-PCR (Flu A&B, Covid) Nasopharyngeal Swab     Status: None   Collection Time: 05/06/20 12:51 PM   Specimen: Nasopharyngeal Swab; Nasopharyngeal(NP) swabs in vial transport medium  Result Value Ref Range Status   SARS Coronavirus 2 by RT PCR NEGATIVE NEGATIVE Final    Comment: (NOTE) SARS-CoV-2 target nucleic acids are NOT DETECTED.  The SARS-CoV-2 RNA is generally detectable in upper  respiratory specimens during the acute phase of infection. The lowest concentration of SARS-CoV-2 viral copies this assay can detect is 138 copies/mL. A negative result does not preclude SARS-Cov-2 infection and should not be used as the sole basis for treatment or other patient management decisions. A negative result may occur with  improper specimen collection/handling, submission of specimen other than nasopharyngeal swab, presence of viral mutation(s) within the areas targeted by this assay, and inadequate number of viral copies(<138 copies/mL). A negative result must be combined with clinical observations, patient history, and epidemiological information. The expected result is Negative.  Fact Sheet for Patients:  EntrepreneurPulse.com.au  Fact Sheet for Healthcare Providers:  IncredibleEmployment.be  This test is no t yet approved or cleared by the [place] FDA and  has been authorized for detection and/or diagnosis of SARS-CoV-2 by FDA under an Emergency Use Authorization (EUA). This EUA will remain  in effect (meaning this test can be used) for the duration of the COVID-19 declaration under Section 564(b)(1) of the Act, 21 U.S.C.section 360bbb-3(b)(1), unless the authorization is terminated  or revoked sooner.       Influenza A by PCR NEGATIVE NEGATIVE Final   Influenza B by PCR NEGATIVE NEGATIVE Final    Comment: (NOTE) The Xpert Xpress SARS-CoV-2/FLU/RSV plus assay is intended as an aid in the diagnosis of influenza from Nasopharyngeal swab specimens and should not be used as a sole basis for treatment. Nasal washings and aspirates are unacceptable for Xpert Xpress SARS-CoV-2/FLU/RSV testing.  Fact Sheet for Patients: EntrepreneurPulse.com.au  Fact Sheet for Healthcare Providers: IncredibleEmployment.be  This test is not yet approved or cleared by the [place] FDA and has been  authorized for detection and/or diagnosis of SARS-CoV-2 by FDA under an Emergency Use Authorization (EUA). This EUA will remain in effect (meaning this test can be used) for the duration of the COVID-19 declaration under Section 564(b)(1) of the Act, 21 U.S.C. section 360bbb-3(b)(1), unless the authorization is terminated or revoked.  Performed at [facility], [address] [place], [place] [address]   CULTURE, BLOOD (ROUTINE X 2) w Reflex to ID Panel     Status: None (Preliminary result)   Collection Time: 05/06/20  2:45 PM   Specimen: BLOOD  Result Value Ref Range Status   Specimen Description BLOOD BLOOD RIGHT HAND  Final   Special Requests   Final    BOTTLES DRAWN AEROBIC AND ANAEROBIC Blood Culture adequate volume   Culture   Final    NO GROWTH 3 DAYS Performed at [facility], [address] [place],  [address]    Report Status PENDING  Incomplete  CULTURE, BLOOD (ROUTINE X 2) w Reflex to ID Panel     Status: None (Preliminary result)   Collection  Time: 05/06/20  2:45 PM   Specimen: BLOOD  Result Value Ref Range Status   Specimen Description BLOOD BLOOD LEFT HAND  Final   Special Requests   Final    BOTTLES DRAWN AEROBIC AND ANAEROBIC Blood Culture adequate volume   Culture   Final    NO GROWTH 3 DAYS Performed at [facility], [address] [place], [place] [address]    Report Status PENDING  Incomplete     Labs: BNP (last 3 results) No results for input(s): BNP in the last 8760 hours. Basic Metabolic Panel: Recent Labs  Lab 05/06/20 1105 05/07/20 0531 05/09/20 0431  NA 136 137 140  K 3.5 3.9 4.4  CL 104 103 108  CO2 22 25 24   GLUCOSE 132* 90 105*  BUN 19 17 21   CREATININE 1.02 0.96 1.14  CALCIUM 8.6* 8.6* 8.7*   Liver Function Tests: Recent Labs  Lab 05/06/20 1105  AST 22  ALT 22  ALKPHOS 75  BILITOT 0.9  PROT 7.5  ALBUMIN 4.4   No results for input(s): LIPASE, AMYLASE in the last 168 hours. No  results for input(s): AMMONIA in the last 168 hours. CBC: Recent Labs  Lab 05/06/20 1105 05/07/20 0531 05/08/20 0324 05/09/20 0431  WBC 12.5* 11.1* 9.7 9.1  NEUTROABS 9.2*  --   --   --   HGB 16.9 16.0 16.1 16.5  HCT 49.4 45.7 45.8 48.4  MCV 89.2 90.3 90.2 90.3  PLT 345 294 275 284   Cardiac Enzymes: No results for input(s): CKTOTAL, CKMB, CKMBINDEX, TROPONINI in the last 168 hours. BNP: Invalid input(s): POCBNP CBG: No results for input(s): GLUCAP in the last 168 hours. D-Dimer No results for input(s): DDIMER in the last 72 hours. Hgb A1c Recent Labs    05/07/20 0531  HGBA1C 5.4   Lipid Profile Recent Labs    05/07/20 0531  CHOL 140  HDL 29*  LDLCALC 87  TRIG 121  CHOLHDL 4.8   Thyroid function studies No results for input(s): TSH, T4TOTAL, T3FREE, THYROIDAB in the last 72 hours.  Invalid input(s): FREET3 Anemia work up No results for input(s): VITAMINB12, FOLATE, FERRITIN, TIBC, IRON, RETICCTPCT in the last 72 hours. Urinalysis    Component Value Date/Time   COLORURINE YELLOW (A) 05/06/2020 1700   APPEARANCEUR CLEAR (A) 05/06/2020 1700   LABSPEC 1.019 05/06/2020 1700   PHURINE 6.0 05/06/2020 1700   GLUCOSEU NEGATIVE 05/06/2020 1700   HGBUR SMALL (A) 05/06/2020 1700   BILIRUBINUR NEGATIVE 05/06/2020 1700   KETONESUR 5 (A) 05/06/2020 1700   PROTEINUR NEGATIVE 05/06/2020 1700   NITRITE NEGATIVE 05/06/2020 1700   LEUKOCYTESUR NEGATIVE 05/06/2020 1700   Sepsis Labs Invalid input(s): PROCALCITONIN,  WBC,  LACTICIDVEN Microbiology Recent Results (from the past 240 hour(s))  Resp Panel by RT-PCR (Flu A&B, Covid) Nasopharyngeal Swab     Status: None   Collection Time: 05/06/20 12:51 PM   Specimen: Nasopharyngeal Swab; Nasopharyngeal(NP) swabs in vial transport medium  Result Value Ref Range Status   SARS Coronavirus 2 by RT PCR NEGATIVE NEGATIVE Final    Comment: (NOTE) SARS-CoV-2 target nucleic acids are NOT DETECTED.  The SARS-CoV-2 RNA is generally  detectable in upper respiratory specimens during the acute phase of infection. The lowest concentration of SARS-CoV-2 viral copies this assay can detect is 138 copies/mL. A negative result does not preclude SARS-Cov-2 infection and should not be used as the sole basis for treatment or other patient management decisions. A negative result may occur with  improper specimen collection/handling, submission of specimen other than nasopharyngeal swab, presence of viral mutation(s) within the areas targeted by this assay, and inadequate number of viral copies(<138 copies/mL). A negative result must be combined with clinical observations, patient history, and epidemiological information. The expected result is Negative.  Fact Sheet for Patients:  EntrepreneurPulse.com.au  Fact Sheet for Healthcare Providers:  IncredibleEmployment.be  This test is no t yet approved or cleared by the [place] FDA and  has been authorized for detection and/or diagnosis of SARS-CoV-2 by FDA under an Emergency Use Authorization (EUA). This EUA will remain  in effect (meaning this test can be used) for the duration of the COVID-19 declaration under Section 564(b)(1) of the Act, 21 U.S.C.section 360bbb-3(b)(1), unless the authorization is terminated  or revoked sooner.       Influenza A by PCR NEGATIVE NEGATIVE Final   Influenza B by PCR NEGATIVE NEGATIVE Final    Comment: (NOTE) The Xpert Xpress SARS-CoV-2/FLU/RSV plus assay is intended as an aid in the diagnosis of influenza from Nasopharyngeal swab specimens and should not be used as a sole basis for treatment. Nasal washings and aspirates are unacceptable for Xpert Xpress SARS-CoV-2/FLU/RSV testing.  Fact Sheet for Patients: EntrepreneurPulse.com.au  Fact Sheet for Healthcare Providers: IncredibleEmployment.be  This test is not yet approved or cleared by the [place] FDA  and has been authorized for detection and/or diagnosis of SARS-CoV-2 by FDA under an Emergency Use Authorization (EUA). This EUA will remain in effect (meaning this test can be used) for the duration of the COVID-19 declaration under Section 564(b)(1) of the Act, 21 U.S.C. section 360bbb-3(b)(1), unless the authorization is terminated or revoked.  Performed at [facility], [address] [place], [place] [address]   CULTURE, BLOOD (ROUTINE X 2) w Reflex to ID Panel     Status: None (Preliminary result)   Collection Time: 05/06/20  2:45 PM   Specimen: BLOOD  Result Value Ref Range Status   Specimen Description BLOOD BLOOD RIGHT HAND  Final   Special Requests   Final    BOTTLES DRAWN AEROBIC AND ANAEROBIC Blood Culture adequate volume   Culture   Final    NO GROWTH 3 DAYS Performed at [facility], [address], [place], [place] [address]    Report Status PENDING  Incomplete  CULTURE, BLOOD (ROUTINE X 2) w Reflex to ID Panel     Status: None (Preliminary result)   Collection Time: 05/06/20  2:45 PM   Specimen: BLOOD  Result Value Ref Range Status   Specimen Description BLOOD BLOOD LEFT HAND  Final   Special Requests   Final    BOTTLES DRAWN AEROBIC AND ANAEROBIC Blood Culture adequate volume   Culture   Final    NO GROWTH 3 DAYS Performed at [facility], [address], [place], [place] [address]    Report Status PENDING  Incomplete     Time coordinating discharge: Over 30 minutes  SIGNED:   Wyvonnia Dusky, MD  [facility] 05/09/2020, 2:15 PM Pager   If 7PM-7AM, please contact night-coverage

## 2020-05-09 NOTE — Progress Notes (Signed)
Discharge instructions (including medications) discussed with and copy provided to patient/caregiver.  Encourage patient to keep all follow up appointments.

## 2020-05-09 NOTE — Progress Notes (Signed)
Patient Name: Marco Carpenter Date of Encounter: 05/09/2020  Hospital Problem List     Principal Problem:   Acute metabolic encephalopathy Active Problems:   Chest pain   Elevated troponin   Headache   Leukocytosis   Elevated blood pressure reading   Sphenoid sinusitis    Patient Profile        65 y.o.malewith history oftobacco abuse30 years ago, but no apparent cardiac history who presented to the emergency room with altered mental status. He does not remember havingchest painand has relative amnesiaregarding any events of yesterday. 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 In the er, he stated he felt badly and had a head ache. EMS reported chest pain but patient denied chest pain in the er. Complained of severe headache in the right frontal area above his right eye nonradiating. No focal findings. Brain MRI was unremarkable. Brain CT showed severe chronic left sphenoid sinusitis. There is additional disease in the left frontal bilateral ethmoid right sphenoid and left maxillary sinuses. He had a troponin level of 25 on presentation with a white count of 12.5. Covid negative. EKG x2 showed normal sinus rhythm with no ischemia. Serum troponin was trended and increased to 239 however subsequent value was 97.   Subjective   Doing well, dressed to go home.  Inpatient Medications    . amoxicillin-clavulanate  1 tablet Oral BID  . aspirin EC  81 mg Oral Daily  . atorvastatin  40 mg Oral Daily  . enoxaparin (LOVENOX) injection  40 mg Subcutaneous Q24H    Vital Signs    Vitals:   05/08/20 0747 05/08/20 2042 05/09/20 0439 05/09/20 0845  BP: 108/73 126/78 115/71 128/90  Pulse: (!) 55 77 78 60  Resp: 17 18 16 16   Temp: 98.1 F (36.7 C) 99.6 F (37.6 C) 97.9 F (36.6 C) 97.6 F (36.4 C)  TempSrc:  Oral Oral Oral Oral  SpO2: 95% 95% 98% 100%  Weight:      Height:        Intake/Output Summary (Last 24 hours) at 05/09/2020 1245 Last data filed at 05/09/2020 0945 Gross per 24 hour  Intake 840 ml  Output 300 ml  Net 540 ml   Filed Weights   05/06/20 1113 05/08/20 0431  Weight: 81.6 kg 89.1 kg    Physical Exam    GEN: Well nourished, well developed, in no acute distress.  HEENT: normal.  Neck: Supple, no JVD, carotid bruits, or masses. Cardiac: RRR, no murmurs, rubs, or gallops. No clubbing, cyanosis, edema.  Radials/DP/PT 2+ and equal bilaterally.  Respiratory:  Respirations regular and unlabored, clear to auscultation bilaterally. GI: Soft, nontender, nondistended, BS + x 4. MS: no deformity or atrophy. Skin: warm and dry, no rash. Neuro:  Strength and sensation are intact. Psych: Normal affect.  Labs    CBC Recent Labs    05/08/20 0324 05/09/20 0431  WBC 9.7 9.1  HGB 16.1 16.5  HCT 45.8 48.4  MCV 90.2 90.3  PLT 275 300   Basic Metabolic Panel Recent Labs    05/07/20 0531 05/09/20 0431  NA 137 140  K 3.9 4.4  CL 103 108  CO2 25 24  GLUCOSE 90 105*  BUN 17 21  CREATININE 0.96 1.14  CALCIUM 8.6* 8.7*   Liver Function Tests No results for input(s): AST, ALT, ALKPHOS,  BILITOT, PROT, ALBUMIN in the last 72 hours. No results for input(s): LIPASE, AMYLASE in the last 72 hours. Cardiac Enzymes No results for input(s): CKTOTAL, CKMB, CKMBINDEX, TROPONINI in the last 72 hours. BNP No results for input(s): BNP in the last 72 hours. D-Dimer No results for input(s): DDIMER in the last 72 hours. Hemoglobin A1C Recent Labs    05/07/20 0531  HGBA1C 5.4   Fasting Lipid Panel Recent Labs    05/07/20 0531  CHOL 140  HDL 29*  LDLCALC 87  TRIG 121  CHOLHDL 4.8   Thyroid Function Tests No results for input(s): TSH, T4TOTAL, T3FREE, THYROIDAB in the last 72 hours.  Invalid input(s): FREET3  Telemetry    Normal sinus rhythm  ECG    Normal sinus  rhythm with no ischemia  Radiology    CT Head Wo Contrast  Result Date: 05/06/2020 CLINICAL DATA:  Altered mental status and headache EXAM: CT HEAD WITHOUT CONTRAST TECHNIQUE: Contiguous axial images were obtained from the base of the skull through the vertex without intravenous contrast. COMPARISON:  March 29, 2020 FINDINGS: Brain: Ventricles and sulci are normal in size and configuration. There is no intracranial mass, hemorrhage, extra-axial fluid collection, or midline shift. The brain parenchyma appears unremarkable. There is no appreciable acute infarct. Vascular: No hyperdense vessel.  No evident vascular calcification. Skull: The bony calvarium appears intact. Sinuses/Orbits: There is diffuse opacification of the left maxillary antrum. There is mild mucosal thickening in the right maxillary antrum. There is opacification in the visualized left maxillary antrum along the periphery as well as opacification in several ethmoid air cells. Orbits appear symmetric bilaterally. Other: Mastoid air cells are clear. IMPRESSION: Multifocal paranasal sinus disease.  Study otherwise unremarkable. Electronically Signed   By: Lowella Grip III M.D.   On: 05/06/2020 12:05   MR BRAIN W WO CONTRAST  Result Date: 05/06/2020 CLINICAL DATA:  Headache and encephalopathy EXAM: MRI HEAD WITHOUT AND WITH CONTRAST MRV HEAD WITHOUT AND WITH CONTRAST TECHNIQUE: Multiplanar, multiecho pulse sequences of the brain and surrounding structures were obtained without and with intravenous contrast. Angiographic images of the intracranial venous structures were obtained using MRV technique without and with intravenous contrast. CONTRAST:  44mL GADAVIST GADOBUTROL 1 MMOL/ML IV SOLN COMPARISON:  None. FINDINGS: MRI BRAIN FINDINGS Brain: No acute infarct, mass effect or extra-axial collection. No acute or chronic hemorrhage. Normal white matter signal, parenchymal volume and CSF spaces. The midline structures are normal. Vascular: Major  flow voids are preserved. Skull and upper cervical spine: Normal calvarium and skull base. Visualized upper cervical spine and soft tissues are normal. Sinuses/Orbits:Sphenoid sinus mucosal thickening and opacification. Normal orbits. MRV BRAIN FINDINGS Superior sagittal sinus: Normal. Straight sinus: Normal. Inferior sagittal sinus, vein of Galen and internal cerebral veins: Normal. Transverse sinuses: Normal. Sigmoid sinuses: Normal. Visualized jugular veins: Normal. IMPRESSION: 1. No acute intracranial abnormality. 2. Normal intracranial MRV. Electronically Signed   By: Ulyses Jarred M.D.   On: 05/06/2020 19:53   NM Myocar Multi W/Spect W/Wall Motion / EF  Result Date: 05/08/2020  Blood pressure demonstrated a normal response to exercise.  There was no ST segment deviation noted during stress.  The study is normal.  This is a low risk study.  The left ventricular ejection fraction is normal (55-65%).    DG Chest Portable 1 View  Result Date: 05/06/2020 CLINICAL DATA:  Hypoxia with chest pain. EXAM: PORTABLE CHEST 1 VIEW COMPARISON:  None. FINDINGS: 1200 hours. Low volume film. Cardiopericardial silhouette is at upper  limits of normal for size. There is pulmonary vascular congestion without overt pulmonary edema. The visualized bony structures of the thorax show no acute abnormality. Telemetry leads overlie the chest. IMPRESSION: Low volume film with pulmonary vascular congestion. Electronically Signed   By: Misty Stanley M.D.   On: 05/06/2020 12:48   MR MRV HEAD W WO CONTRAST  Result Date: 05/06/2020 CLINICAL DATA:  Headache and encephalopathy EXAM: MRI HEAD WITHOUT AND WITH CONTRAST MRV HEAD WITHOUT AND WITH CONTRAST TECHNIQUE: Multiplanar, multiecho pulse sequences of the brain and surrounding structures were obtained without and with intravenous contrast. Angiographic images of the intracranial venous structures were obtained using MRV technique without and with intravenous contrast. CONTRAST:   50mL GADAVIST GADOBUTROL 1 MMOL/ML IV SOLN COMPARISON:  None. FINDINGS: MRI BRAIN FINDINGS Brain: No acute infarct, mass effect or extra-axial collection. No acute or chronic hemorrhage. Normal white matter signal, parenchymal volume and CSF spaces. The midline structures are normal. Vascular: Major flow voids are preserved. Skull and upper cervical spine: Normal calvarium and skull base. Visualized upper cervical spine and soft tissues are normal. Sinuses/Orbits:Sphenoid sinus mucosal thickening and opacification. Normal orbits. MRV BRAIN FINDINGS Superior sagittal sinus: Normal. Straight sinus: Normal. Inferior sagittal sinus, vein of Galen and internal cerebral veins: Normal. Transverse sinuses: Normal. Sigmoid sinuses: Normal. Visualized jugular veins: Normal. IMPRESSION: 1. No acute intracranial abnormality. 2. Normal intracranial MRV. Electronically Signed   By: Ulyses Jarred M.D.   On: 05/06/2020 19:53    Assessment & Plan    65 y.o.malewith history oftobacco abuse, but no apparent cardiac history who presented to the emergency room with altered mental status and a headache. 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 Chest pain wasmentioned by EMS but pt denies any recollection of having chest pain and denied in er.Complained of severe headache.No focal findings. Brain MRI was unremarkable. Brain CT showed severe chronic left sphenoid sinusitis. There is additional disease in the left frontal bilateral ethmoid right sphenoid and left maxillary sinuses. He had a troponin level of 25 on presentation with a white count of 12.5. Covid negative. EKG x2 showed normal sinus rhythm with no ischemia. Serum troponin was trended and increased to 239 however subsequent value was 97. Patient was given heparin. Renal function was normal. Sed rate was normal. White count 11.1.  1. Chest pain/abnormal troponin-Pt does not recal having chest pain and denied  in er. Was reported by EMS.Troponin peaked at 239 and now back down to 97. EKG unremarkable for ischemia. Borderline for ACS. Was placed on heparin. No further chest pain. Risk factors for cad in includeremotetobacco abuseand situationalHypertension. He is on no medications. Very atypical picture for ACS. Functional study negative for ischemia. No further cardiac workup indicated. OK for discharge from cardiac standpoint  2. Headache-has had headaches since head trauma approximately 1-2 months ago.Also has left-sided sinusitis may be playing a role in his headache.  Doing well at present neurologically intact and anxious to go home.   Signed, Javier Docker Kiron Osmun MD 05/09/2020, 12:45 PM  Pager: [phone]

## 2020-05-09 NOTE — Care Management Important Message (Signed)
Important Message  Patient Details  Name: Marco Carpenter MRN: [id number] Date of Birth: 10-Aug-1955   Medicare Important Message Given:  Yes     Dannette Barbara 05/09/2020, 11:09 AM

## 2020-05-11 LAB — CULTURE, BLOOD (ROUTINE X 2)
Culture: NO GROWTH
Culture: NO GROWTH
Special Requests: ADEQUATE
Special Requests: ADEQUATE

## 2021-02-27 DIAGNOSIS — J209 Acute bronchitis, unspecified: Secondary | ICD-10-CM | POA: Diagnosis not present

## 2021-03-14 DIAGNOSIS — Z86010 Personal history of colonic polyps: Secondary | ICD-10-CM | POA: Diagnosis not present

## 2021-04-10 DIAGNOSIS — K5730 Diverticulosis of large intestine without perforation or abscess without bleeding: Secondary | ICD-10-CM | POA: Diagnosis not present

## 2021-04-10 DIAGNOSIS — Z86010 Personal history of colonic polyps: Secondary | ICD-10-CM | POA: Diagnosis not present

## 2021-06-21 DIAGNOSIS — R030 Elevated blood-pressure reading, without diagnosis of hypertension: Secondary | ICD-10-CM | POA: Diagnosis not present

## 2021-07-17 DIAGNOSIS — J310 Chronic rhinitis: Secondary | ICD-10-CM | POA: Diagnosis not present

## 2021-10-17 DIAGNOSIS — R7303 Prediabetes: Secondary | ICD-10-CM | POA: Diagnosis not present

## 2021-10-17 DIAGNOSIS — Z0000 Encounter for general adult medical examination without abnormal findings: Secondary | ICD-10-CM | POA: Diagnosis not present

## 2021-10-24 DIAGNOSIS — Z6831 Body mass index (BMI) 31.0-31.9, adult: Secondary | ICD-10-CM | POA: Diagnosis not present

## 2021-10-24 DIAGNOSIS — R030 Elevated blood-pressure reading, without diagnosis of hypertension: Secondary | ICD-10-CM | POA: Diagnosis not present

## 2021-10-24 DIAGNOSIS — E6609 Other obesity due to excess calories: Secondary | ICD-10-CM | POA: Diagnosis not present

## 2021-10-24 DIAGNOSIS — Z0000 Encounter for general adult medical examination without abnormal findings: Secondary | ICD-10-CM | POA: Diagnosis not present

## 2021-10-24 DIAGNOSIS — R7303 Prediabetes: Secondary | ICD-10-CM | POA: Diagnosis not present

## 2022-04-17 DIAGNOSIS — R7303 Prediabetes: Secondary | ICD-10-CM | POA: Diagnosis not present

## 2022-04-17 DIAGNOSIS — J069 Acute upper respiratory infection, unspecified: Secondary | ICD-10-CM | POA: Diagnosis not present

## 2022-08-22 DIAGNOSIS — E663 Overweight: Secondary | ICD-10-CM | POA: Diagnosis not present

## 2022-08-22 DIAGNOSIS — Z87891 Personal history of nicotine dependence: Secondary | ICD-10-CM | POA: Diagnosis not present

## 2022-10-21 DIAGNOSIS — R030 Elevated blood-pressure reading, without diagnosis of hypertension: Secondary | ICD-10-CM | POA: Diagnosis not present

## 2022-10-21 DIAGNOSIS — R7303 Prediabetes: Secondary | ICD-10-CM | POA: Diagnosis not present

## 2022-10-21 DIAGNOSIS — Z0000 Encounter for general adult medical examination without abnormal findings: Secondary | ICD-10-CM | POA: Diagnosis not present

## 2022-10-28 DIAGNOSIS — E6609 Other obesity due to excess calories: Secondary | ICD-10-CM | POA: Diagnosis not present

## 2022-10-28 DIAGNOSIS — Z23 Encounter for immunization: Secondary | ICD-10-CM | POA: Diagnosis not present

## 2022-10-28 DIAGNOSIS — R030 Elevated blood-pressure reading, without diagnosis of hypertension: Secondary | ICD-10-CM | POA: Diagnosis not present

## 2022-10-28 DIAGNOSIS — Z0000 Encounter for general adult medical examination without abnormal findings: Secondary | ICD-10-CM | POA: Diagnosis not present

## 2022-10-28 DIAGNOSIS — Z6831 Body mass index (BMI) 31.0-31.9, adult: Secondary | ICD-10-CM | POA: Diagnosis not present

## 2023-04-09 DIAGNOSIS — R0602 Shortness of breath: Secondary | ICD-10-CM | POA: Diagnosis not present

## 2023-04-09 DIAGNOSIS — K429 Umbilical hernia without obstruction or gangrene: Secondary | ICD-10-CM | POA: Diagnosis not present

## 2023-04-09 DIAGNOSIS — J40 Bronchitis, not specified as acute or chronic: Secondary | ICD-10-CM | POA: Diagnosis not present

## 2023-04-15 ENCOUNTER — Ambulatory Visit: Payer: Self-pay | Admitting: General Surgery

## 2023-04-15 DIAGNOSIS — K429 Umbilical hernia without obstruction or gangrene: Secondary | ICD-10-CM | POA: Diagnosis not present

## 2023-04-15 NOTE — H&P (View-Only) (Signed)
 PCP:  Marisue Ivan, MD  History of Present Illness Marco Carpenter is a 68 year old male who presents with an umbilical hernia. He was referred by Prudencio Burly, a physician assistant, for evaluation of an umbilical hernia.  He has had an umbilical hernia for approximately three years, with significant pain occurring on three occasions. The most recent episode was about two to three weeks ago, during which the hernia became notably enlarged and painful, to the extent that he could not tolerate his shirt touching it. The pain lasted for about four hours before subsiding.  Pain localized to the umbilical area.  No pain radiation.  Pain aggravated by touching.  No alleviating factors.  He is able to manually reduce the hernia by pushing it back in, but it protrudes again with movement. The hernia pain is exacerbated by activities such as coughing.  He works regularly and had been experiencing respiratory issues, including coughing, which have since improved after an illness.   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   White coat syndrome without diagnosis of hypertension 06/20/2017    GENERAL REVIEW OF SYSTEMS:   General ROS: negative for - chills, fatigue, fever, weight gain or weight loss Allergy and Immunology ROS: negative for - hives  Hematological and Lymphatic ROS: negative for - bleeding problems or bruising, negative for palpable nodes Endocrine ROS: negative for - heat or cold intolerance, hair changes Respiratory ROS: negative for - cough, shortness of breath or wheezing Cardiovascular  ROS: no chest pain or palpitations GI ROS: negative for nausea, vomiting, abdominal pain, diarrhea, constipation Musculoskeletal ROS: negative for - joint swelling or muscle pain Neurological ROS: negative for - confusion, syncope Dermatological ROS: negative for pruritus and rash Psychiatric: negative for anxiety, depression, difficulty sleeping and memory loss  MEDICATIONS: No current outpatient medications on file.   No current facility-administered medications for this visit.    ALLERGIES: Patient has no known allergies.  PAST MEDICAL HISTORY: Past Medical History:  Diagnosis Date   Benign essential HTN 05/18/2020   Diverticulosis 05/29/2015   Hyperlipidemia, mixed 05/18/2020   Tubular adenoma of colon 05/29/2015    PAST SURGICAL HISTORY: Past Surgical History:  Procedure Laterality Date   COLONOSCOPY  05/29/2015   Dr. Eber Hong @ [facility] - Adenomatous Polyps, rpt 3-5 yrs per [facility]   COLONOSCOPY  04/10/2021   PHxCP/Rpt22yrs/TKT     FAMILY HISTORY: Family History  Problem Relation Name Age of Onset   No Known Problems Mother     No Known Problems Father     No Known Problems Sister     No Known Problems Brother     No Known Problems Brother     No Known Problems Brother     No Known Problems Sister       SOCIAL HISTORY: Social History   Socioeconomic History   Marital status: Married    Spouse name: Jan   Number of children: 3  Tobacco Use   Smoking status: Former    Current packs/day: 0.50    Average packs/day: 0.5 packs/day for 33.2 years (16.6  ttl pk-yrs)    Types: Cigarettes    Start date: 01/28/1990    Passive exposure: Past   Smokeless tobacco: Never  Vaping Use   Vaping status: Never Used  Substance and Sexual Activity   Alcohol use: Never   Drug use: No   Sexual activity: Not Currently    Partners: Female  Social History Narrative   Marital Status- Married   Lives with wife   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)   Exercise hx-  Occasional   Religious Affiliation- [employer]   Social Drivers of Health   Financial Resource Strain: Low Risk  (10/28/2022)   Overall Financial Resource Strain (CARDIA)    Difficulty of Paying Living Expenses: Not hard at all  Food Insecurity: No Food Insecurity (10/28/2022)   Hunger Vital Sign    Worried About Running Out of Food in the Last Year: Never true    Ran Out of Food in the Last Year: Never true  Transportation Needs: No Transportation Needs (10/28/2022)   PRAPARE - Administrator, Civil Service (Medical): No    Lack of Transportation (Non-Medical): No    PHYSICAL EXAM: Vitals:   04/15/23 0901  BP: (!) 159/87  Pulse: 61   Body mass index is 31.17 kg/m. Weight: 90.3 kg (199 lb)   GENERAL: Alert, active, oriented x3  HEENT: Pupils equal reactive to light. Extraocular movements are intact. Sclera clear. Palpebral conjunctiva normal red color.Pharynx clear.  NECK: Supple with no palpable mass and no adenopathy.  LUNGS: Sound clear with no rales rhonchi or wheezes.  HEART: Regular rhythm S1 and S2 without murmur.  ABDOMEN: Soft and depressible, nontender with no palpable mass, no hepatomegaly. Palpable umbilical hernia.   EXTREMITIES: Well-developed well-nourished symmetrical with no dependent edema.  NEUROLOGICAL: Awake alert oriented, facial expression symmetrical, moving all extremities.  REVIEW OF DATA: I have reviewed the following data today: Office Visit on 04/09/2023  Component Date Value   Vent Rate (bpm) 04/09/2023 56    PR Interval (msec) 04/09/2023 160    QRS Interval (msec) 04/09/2023 84    QT Interval (msec) 04/09/2023 406    QTc (msec) 04/09/2023 391     Assessment & Plan Umbilical Hernia   He has had an umbilical hernia for approximately three years, with recent exacerbation of symptoms including significant pain and sensitivity. The hernia is small and likely involves fatty tissue rather than intestine, reducing the risk of  incarceration or strangulation. Due to the pain and symptoms, surgical intervention is recommended as hernias do not resolve spontaneously and surgery is the only definitive treatment. Risks of not treating include potential worsening of symptoms and the rare risk of incarceration. The surgical plan involves a small incision under the umbilicus to reposition the fatty tissue and close the defect. If the defect is larger than expected, a mesh may be used for reinforcement. The procedure is a same-day surgery with a recovery period of 2-4 weeks, during which he should avoid lifting over 20 pounds. He agrees to proceed with surgery. Consider using a mesh for reinforcement if the defect is larger than expected during surgery. Advise him to avoid lifting over 20 pounds for 2-4 weeks post-surgery. Plan follow-up visit in two weeks to assess recovery and discuss activity level.  Patient oriented about the surgical procedure, the use of mesh and its risk of complications such as: infection, bleeding, injury to vasculature, injury to bowel or bladder, and chronic pain, intestinal obstruction, among others.   Cough  He had a persistent cough following a respiratory illness, which has now resolved. The cough exacerbated the hernia symptoms.   Umbilical hernia without obstruction and without gangrene [K42.9]  Patient verbalized understanding, all questions were answered, and were agreeable with the plan outlined above.    Carolan Shiver, MD  Electronically signed by Carolan Shiver, MD

## 2023-04-15 NOTE — H&P (Signed)
 PCP:  Marisue Ivan, MD  History of Present Illness Marco Carpenter is a 68 year old male who presents with an umbilical hernia. He was referred by Prudencio Burly, a physician assistant, for evaluation of an umbilical hernia.  He has had an umbilical hernia for approximately three years, with significant pain occurring on three occasions. The most recent episode was about two to three weeks ago, during which the hernia became notably enlarged and painful, to the extent that he could not tolerate his shirt touching it. The pain lasted for about four hours before subsiding.  Pain localized to the umbilical area.  No pain radiation.  Pain aggravated by touching.  No alleviating factors.  He is able to manually reduce the hernia by pushing it back in, but it protrudes again with movement. The hernia pain is exacerbated by activities such as coughing.  He works regularly and had been experiencing respiratory issues, including coughing, which have since improved after an illness.   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   White coat syndrome without diagnosis of hypertension 06/20/2017    GENERAL REVIEW OF SYSTEMS:   General ROS: negative for - chills, fatigue, fever, weight gain or weight loss Allergy and Immunology ROS: negative for - hives  Hematological and Lymphatic ROS: negative for - bleeding problems or bruising, negative for palpable nodes Endocrine ROS: negative for - heat or cold intolerance, hair changes Respiratory ROS: negative for - cough, shortness of breath or wheezing Cardiovascular  ROS: no chest pain or palpitations GI ROS: negative for nausea, vomiting, abdominal pain, diarrhea, constipation Musculoskeletal ROS: negative for - joint swelling or muscle pain Neurological ROS: negative for - confusion, syncope Dermatological ROS: negative for pruritus and rash Psychiatric: negative for anxiety, depression, difficulty sleeping and memory loss  MEDICATIONS: No current outpatient medications on file.   No current facility-administered medications for this visit.    ALLERGIES: Patient has no known allergies.  PAST MEDICAL HISTORY: Past Medical History:  Diagnosis Date   Benign essential HTN 05/18/2020   Diverticulosis 05/29/2015   Hyperlipidemia, mixed 05/18/2020   Tubular adenoma of colon 05/29/2015    PAST SURGICAL HISTORY: Past Surgical History:  Procedure Laterality Date   COLONOSCOPY  05/29/2015   Dr. Eber Hong @ [facility] - Adenomatous Polyps, rpt 3-5 yrs per [facility]   COLONOSCOPY  04/10/2021   PHxCP/Rpt22yrs/TKT     FAMILY HISTORY: Family History  Problem Relation Name Age of Onset   No Known Problems Mother     No Known Problems Father     No Known Problems Sister     No Known Problems Brother     No Known Problems Brother     No Known Problems Brother     No Known Problems Sister       SOCIAL HISTORY: Social History   Socioeconomic History   Marital status: Married    Spouse name: Jan   Number of children: 3  Tobacco Use   Smoking status: Former    Current packs/day: 0.50    Average packs/day: 0.5 packs/day for 33.2 years (16.6  ttl pk-yrs)    Types: Cigarettes    Start date: 01/28/1990    Passive exposure: Past   Smokeless tobacco: Never  Vaping Use   Vaping status: Never Used  Substance and Sexual Activity   Alcohol use: Never   Drug use: No   Sexual activity: Not Currently    Partners: Female  Social History Narrative   Marital Status- Married   Lives with wife   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)   Exercise hx-  Occasional   Religious Affiliation- [employer]   Social Drivers of Health   Financial Resource Strain: Low Risk  (10/28/2022)   Overall Financial Resource Strain (CARDIA)    Difficulty of Paying Living Expenses: Not hard at all  Food Insecurity: No Food Insecurity (10/28/2022)   Hunger Vital Sign    Worried About Running Out of Food in the Last Year: Never true    Ran Out of Food in the Last Year: Never true  Transportation Needs: No Transportation Needs (10/28/2022)   PRAPARE - Administrator, Civil Service (Medical): No    Lack of Transportation (Non-Medical): No    PHYSICAL EXAM: Vitals:   04/15/23 0901  BP: (!) 159/87  Pulse: 61   Body mass index is 31.17 kg/m. Weight: 90.3 kg (199 lb)   GENERAL: Alert, active, oriented x3  HEENT: Pupils equal reactive to light. Extraocular movements are intact. Sclera clear. Palpebral conjunctiva normal red color.Pharynx clear.  NECK: Supple with no palpable mass and no adenopathy.  LUNGS: Sound clear with no rales rhonchi or wheezes.  HEART: Regular rhythm S1 and S2 without murmur.  ABDOMEN: Soft and depressible, nontender with no palpable mass, no hepatomegaly. Palpable umbilical hernia.   EXTREMITIES: Well-developed well-nourished symmetrical with no dependent edema.  NEUROLOGICAL: Awake alert oriented, facial expression symmetrical, moving all extremities.  REVIEW OF DATA: I have reviewed the following data today: Office Visit on 04/09/2023  Component Date Value   Vent Rate (bpm) 04/09/2023 56    PR Interval (msec) 04/09/2023 160    QRS Interval (msec) 04/09/2023 84    QT Interval (msec) 04/09/2023 406    QTc (msec) 04/09/2023 391     Assessment & Plan Umbilical Hernia   He has had an umbilical hernia for approximately three years, with recent exacerbation of symptoms including significant pain and sensitivity. The hernia is small and likely involves fatty tissue rather than intestine, reducing the risk of  incarceration or strangulation. Due to the pain and symptoms, surgical intervention is recommended as hernias do not resolve spontaneously and surgery is the only definitive treatment. Risks of not treating include potential worsening of symptoms and the rare risk of incarceration. The surgical plan involves a small incision under the umbilicus to reposition the fatty tissue and close the defect. If the defect is larger than expected, a mesh may be used for reinforcement. The procedure is a same-day surgery with a recovery period of 2-4 weeks, during which he should avoid lifting over 20 pounds. He agrees to proceed with surgery. Consider using a mesh for reinforcement if the defect is larger than expected during surgery. Advise him to avoid lifting over 20 pounds for 2-4 weeks post-surgery. Plan follow-up visit in two weeks to assess recovery and discuss activity level.  Patient oriented about the surgical procedure, the use of mesh and its risk of complications such as: infection, bleeding, injury to vasculature, injury to bowel or bladder, and chronic pain, intestinal obstruction, among others.   Cough  He had a persistent cough following a respiratory illness, which has now resolved. The cough exacerbated the hernia symptoms.   Umbilical hernia without obstruction and without gangrene [K42.9]  Patient verbalized understanding, all questions were answered, and were agreeable with the plan outlined above.    Carolan Shiver, MD  Electronically signed by Carolan Shiver, MD

## 2023-04-21 ENCOUNTER — Other Ambulatory Visit: Payer: Self-pay

## 2023-04-21 ENCOUNTER — Encounter
Admission: RE | Admit: 2023-04-21 | Discharge: 2023-04-21 | Disposition: A | Discharge status: Home / Self Care | Source: Ambulatory Visit | Attending: General Surgery | Admitting: General Surgery

## 2023-04-21 VITALS — BP 145/95 | HR 58 | Resp 12 | Ht 72.0 in | Wt 201.5 lb

## 2023-04-21 DIAGNOSIS — I10 Essential (primary) hypertension: Secondary | ICD-10-CM | POA: Diagnosis not present

## 2023-04-21 DIAGNOSIS — Z01810 Encounter for preprocedural cardiovascular examination: Secondary | ICD-10-CM

## 2023-04-21 DIAGNOSIS — R001 Bradycardia, unspecified: Secondary | ICD-10-CM | POA: Insufficient documentation

## 2023-04-21 DIAGNOSIS — Z01818 Encounter for other preprocedural examination: Secondary | ICD-10-CM | POA: Diagnosis not present

## 2023-04-21 DIAGNOSIS — Z01812 Encounter for preprocedural laboratory examination: Secondary | ICD-10-CM

## 2023-04-21 HISTORY — DX: Transient global amnesia: G45.4

## 2023-04-21 HISTORY — DX: Prediabetes: R73.03

## 2023-04-21 HISTORY — DX: Umbilical hernia without obstruction or gangrene: K42.9

## 2023-04-21 HISTORY — DX: Benign neoplasm of colon, unspecified: D12.6

## 2023-04-21 HISTORY — DX: Diverticulosis of intestine, part unspecified, without perforation or abscess without bleeding: K57.90

## 2023-04-21 HISTORY — DX: Essential (primary) hypertension: I10

## 2023-04-21 HISTORY — DX: Personal history of nicotine dependence: Z87.891

## 2023-04-21 HISTORY — DX: Hyperlipidemia, unspecified: E78.5

## 2023-04-21 LAB — BASIC METABOLIC PANEL
Anion gap: 6 (ref 5–15)
BUN: 16 mg/dL (ref 8–23)
CO2: 22 mmol/L (ref 22–32)
Calcium: 8.9 mg/dL (ref 8.9–10.3)
Chloride: 109 mmol/L (ref 98–111)
Creatinine, Ser: 0.93 mg/dL (ref 0.61–1.24)
GFR, Estimated: >60 mL/min (ref >60)
Glucose, Bld: 138 mg/dL — ABNORMAL HIGH (ref 70–99)
Potassium: 3.9 mmol/L (ref 3.5–5.1)
Sodium: 137 mmol/L (ref 135–145)

## 2023-04-21 LAB — CBC
HCT: 47.2 % (ref 39.0–52.0)
Hemoglobin: 16.9 g/dL (ref 13.0–17.0)
MCH: 31.7 pg (ref 26.0–34.0)
MCHC: 35.8 g/dL (ref 30.0–36.0)
MCV: 88.6 fL (ref 80.0–100.0)
Platelets: 259 10*3/uL (ref 150–400)
RBC: 5.33 MIL/uL (ref 4.22–5.81)
RDW: 12.7 % (ref 11.5–15.5)
WBC: 5.8 10*3/uL (ref 4.0–10.5)
nRBC: 0 % (ref 0.0–0.2)

## 2023-04-21 NOTE — Patient Instructions (Addendum)
 Your procedure is scheduled on:04-25-23 Friday Report to the Registration Desk on the 1st floor of the [facility].Then proceed to the 2nd floor Surgery Desk To find out your arrival time, please call [phone] between 1PM - 3PM on:04-24-23 Thursday If your arrival time is 6:00 am, do not arrive before that time as the [facility] entrance doors do not open until 6:00 am.  REMEMBER: Instructions that are not followed completely may result in serious medical risk, up to and including death; or upon the discretion of your surgeon and anesthesiologist your surgery may need to be rescheduled.  Do not eat food Or drink any liquids after midnight the night before surgery.  No gum chewing or hard candies.  One week prior to surgery:Stop NOW (04-21-23) Stop Anti-inflammatories (NSAIDS) such as Advil, Aleve, Ibuprofen, Motrin, Naproxen, Naprosyn and Aspirin based products such as Excedrin, Goody's Powder, BC Powder. Stop ANY OVER THE COUNTER supplements until after surgery.  You may however, continue to take Tylenol if needed for pain up until the day of surgery.  Continue taking all of your other prescription medications up until the day of surgery.  Do NOT take any medication the day of surgery  No Alcohol for 24 hours before or after surgery.  No Smoking including e-cigarettes for 24 hours before surgery.  No chewable tobacco products for at least 6 hours before surgery.  No nicotine patches on the day of surgery.  Do not use any "recreational" drugs for at least a week (preferably 2 weeks) before your surgery.  Please be advised that the combination of cocaine and anesthesia may have negative outcomes, up to and including death. If you test positive for cocaine, your surgery will be cancelled.  On the morning of surgery brush your teeth with toothpaste and water, you may rinse your mouth with mouthwash if you wish. Do not swallow any toothpaste or mouthwash.  Use CHG Soap as directed  on instruction sheet.  Do not wear jewelry, make-up, hairpins, clips or nail polish.  For welded (permanent) jewelry: bracelets, anklets, waist bands, etc.  Please have this removed prior to surgery.  If it is not removed, there is a chance that hospital personnel will need to cut it off on the day of surgery.  Do not wear lotions, powders, or perfumes.   Do not shave body hair from the neck down 48 hours before surgery.  Contact lenses, hearing aids and dentures may not be worn into surgery.  Do not bring valuables to the hospital. [facility] is not responsible for any missing/lost belongings or valuables.   Notify your doctor if there is any change in your medical condition (cold, fever, infection).  Wear comfortable clothing (specific to your surgery type) to the hospital.  After surgery, you can help prevent lung complications by doing breathing exercises.  Take deep breaths and cough every 1-2 hours. Your doctor may order a device called an Incentive Spirometer to help you take deep breaths. When coughing or sneezing, hold a pillow firmly against your incision with both hands. This is called "splinting." Doing this helps protect your incision. It also decreases belly discomfort.  If you are being admitted to the hospital overnight, leave your suitcase in the car. After surgery it may be brought to your room.  In case of increased patient census, it may be necessary for you, the patient, to continue your postoperative care in the Same Day Surgery department.  If you are being discharged the day of  surgery, you will not be allowed to drive home. You will need a responsible individual to drive you home and stay with you for 24 hours after surgery.   If you are taking public transportation, you will need to have a responsible individual with you.  Please call the Pre-admissions Testing Dept. at [phone] if you have any questions about these instructions.  Surgery Visitation  Policy:  Patients having surgery or a procedure may have two visitors.  Children under the age of 19 must have an adult with them who is not the patient.  Temporary Visitor Restrictions Due to increasing cases of flu, RSV and COVID-19: Children ages 27 and under will not be able to visit patients in [facility] hospitals under most circumstances.     Preparing for Surgery with CHLORHEXIDINE GLUCONATE (CHG) Soap  Chlorhexidine Gluconate (CHG) Soap  o An antiseptic cleaner that kills germs and bonds with the skin to continue killing germs even after washing  o Used for showering the night before surgery and morning of surgery  Before surgery, you can play an important role by reducing the number of germs on your skin.  CHG (Chlorhexidine gluconate) soap is an antiseptic cleanser which kills germs and bonds with the skin to continue killing germs even after washing.  Please do not use if you have an allergy to CHG or antibacterial soaps. If your skin becomes reddened/irritated stop using the CHG.  1. Shower the NIGHT BEFORE SURGERY and the MORNING OF SURGERY with CHG soap.  2. If you choose to wash your hair, wash your hair first as usual with your normal shampoo.  3. After shampooing, rinse your hair and body thoroughly to remove the shampoo.  4. Use CHG as you would any other liquid soap. You can apply CHG directly to the skin and wash gently with a scrungie or a clean washcloth.  5. Apply the CHG soap to your body only from the neck down. Do not use on open wounds or open sores. Avoid contact with your eyes, ears, mouth, and genitals (private parts). Wash face and genitals (private parts) with your normal soap.  6. Wash thoroughly, paying special attention to the area where your surgery will be performed.  7. Thoroughly rinse your body with warm water.  8. Do not shower/wash with your normal soap after using and rinsing off the CHG soap.  9. Pat yourself dry with a clean  towel.  10. Wear clean pajamas to bed the night before surgery.  12. Place clean sheets on your bed the night of your first shower and do not sleep with pets.  13. Shower again with the CHG soap on the day of surgery prior to arriving at the hospital.  14. Do not apply any deodorants/lotions/powders.  15. Please wear clean clothes to the hospital.

## 2023-04-25 ENCOUNTER — Other Ambulatory Visit: Payer: Self-pay

## 2023-04-25 ENCOUNTER — Ambulatory Visit: Payer: Self-pay | Admitting: Urgent Care

## 2023-04-25 ENCOUNTER — Ambulatory Visit
Admission: RE | Admit: 2023-04-25 | Discharge: 2023-04-25 | Disposition: A | Discharge status: Home / Self Care | Attending: General Surgery | Admitting: General Surgery

## 2023-04-25 ENCOUNTER — Encounter: Payer: Self-pay | Admitting: General Surgery

## 2023-04-25 ENCOUNTER — Ambulatory Visit

## 2023-04-25 ENCOUNTER — Encounter
Admission: RE | Disposition: A | Discharge status: Home / Self Care | Payer: Self-pay | Source: Home / Self Care | Attending: General Surgery

## 2023-04-25 DIAGNOSIS — Z87891 Personal history of nicotine dependence: Secondary | ICD-10-CM | POA: Diagnosis not present

## 2023-04-25 DIAGNOSIS — K429 Umbilical hernia without obstruction or gangrene: Secondary | ICD-10-CM | POA: Diagnosis not present

## 2023-04-25 DIAGNOSIS — I10 Essential (primary) hypertension: Secondary | ICD-10-CM | POA: Diagnosis not present

## 2023-04-25 DIAGNOSIS — K420 Umbilical hernia with obstruction, without gangrene: Secondary | ICD-10-CM | POA: Diagnosis not present

## 2023-04-25 HISTORY — PX: INSERTION OF MESH: SHX5868

## 2023-04-25 HISTORY — PX: UMBILICAL HERNIA REPAIR: SHX196

## 2023-04-25 SURGERY — REPAIR, HERNIA, UMBILICAL, ADULT
Anesthesia: General | Site: Abdomen

## 2023-04-25 MED ORDER — DEXAMETHASONE SODIUM PHOSPHATE 10 MG/ML IJ SOLN
INTRAMUSCULAR | Status: DC | PRN
  Administered 2023-04-25: 10 mg via INTRAVENOUS

## 2023-04-25 MED ORDER — MIDAZOLAM HCL 2 MG/2ML IJ SOLN
INTRAMUSCULAR | Status: DC | PRN
  Administered 2023-04-25: 2 mg via INTRAVENOUS

## 2023-04-25 MED ORDER — FENTANYL CITRATE (PF) 100 MCG/2ML IJ SOLN: 25.0000 ug | INTRAMUSCULAR | Status: DC | PRN

## 2023-04-25 MED ORDER — ONDANSETRON HCL 4 MG/2ML IJ SOLN: 4.0000 mg | Freq: Once | INTRAMUSCULAR | Status: DC | PRN

## 2023-04-25 MED ORDER — PROPOFOL 10 MG/ML IV BOLUS
INTRAVENOUS | Status: DC | PRN
  Administered 2023-04-25: 50 mg via INTRAVENOUS
  Administered 2023-04-25: 150 mg via INTRAVENOUS

## 2023-04-25 MED ORDER — PROPOFOL 10 MG/ML IV BOLUS
INTRAVENOUS | Status: AC
  Filled 2023-04-25: qty 20

## 2023-04-25 MED ORDER — EPHEDRINE SULFATE-NACL 50-0.9 MG/10ML-% IV SOSY
PREFILLED_SYRINGE | INTRAVENOUS | Status: DC | PRN
  Administered 2023-04-25: 10 mg via INTRAVENOUS

## 2023-04-25 MED ORDER — LIDOCAINE HCL (CARDIAC) PF 100 MG/5ML IV SOSY
PREFILLED_SYRINGE | INTRAVENOUS | Status: DC | PRN
  Administered 2023-04-25: 100 mg via INTRAVENOUS

## 2023-04-25 MED ORDER — FENTANYL CITRATE (PF) 100 MCG/2ML IJ SOLN
INTRAMUSCULAR | Status: DC | PRN
  Administered 2023-04-25 (×2): 50 ug via INTRAVENOUS

## 2023-04-25 MED ORDER — ROCURONIUM BROMIDE 100 MG/10ML IV SOLN
INTRAVENOUS | Status: DC | PRN
  Administered 2023-04-25: 50 mg via INTRAVENOUS

## 2023-04-25 MED ORDER — CHLORHEXIDINE GLUCONATE 0.12 % MT SOLN
15.0000 mL | Freq: Once | OROMUCOSAL | Status: AC
  Administered 2023-04-25: 15 mL via OROMUCOSAL

## 2023-04-25 MED ORDER — HYDROCODONE-ACETAMINOPHEN 5-325 MG PO TABS: 1.0000 | ORAL_TABLET | Freq: Four times a day (QID) | ORAL | 0 refills | Status: AC | PRN

## 2023-04-25 MED ORDER — CEFAZOLIN SODIUM-DEXTROSE 2-4 GM/100ML-% IV SOLN
2.0000 g | INTRAVENOUS | Status: AC
  Administered 2023-04-25: 2 g via INTRAVENOUS

## 2023-04-25 MED ORDER — BUPIVACAINE-EPINEPHRINE (PF) 0.5% -1:200000 IJ SOLN
INTRAMUSCULAR | Status: AC
  Filled 2023-04-25: qty 30

## 2023-04-25 MED ORDER — DEXMEDETOMIDINE HCL IN NACL 200 MCG/50ML IV SOLN
INTRAVENOUS | Status: DC | PRN
  Administered 2023-04-25: 8 ug via INTRAVENOUS

## 2023-04-25 MED ORDER — 0.9 % SODIUM CHLORIDE (POUR BTL) OPTIME
TOPICAL | Status: DC | PRN
  Administered 2023-04-25: 500 mL

## 2023-04-25 MED ORDER — OXYCODONE HCL 5 MG PO TABS: 5.0000 mg | ORAL_TABLET | Freq: Once | ORAL | Status: DC | PRN

## 2023-04-25 MED ORDER — LACTATED RINGERS IV SOLN: INTRAVENOUS | Status: DC

## 2023-04-25 MED ORDER — BUPIVACAINE-EPINEPHRINE 0.5% -1:200000 IJ SOLN
INTRAMUSCULAR | Status: DC | PRN
  Administered 2023-04-25: 30 mL

## 2023-04-25 MED ORDER — CHLORHEXIDINE GLUCONATE 0.12 % MT SOLN
OROMUCOSAL | Status: AC
  Filled 2023-04-25: qty 15

## 2023-04-25 MED ORDER — FENTANYL CITRATE (PF) 100 MCG/2ML IJ SOLN
INTRAMUSCULAR | Status: AC
  Filled 2023-04-25: qty 2

## 2023-04-25 MED ORDER — ORAL CARE MOUTH RINSE: 15.0000 mL | Freq: Once | OROMUCOSAL | Status: AC

## 2023-04-25 MED ORDER — CEFAZOLIN SODIUM-DEXTROSE 2-4 GM/100ML-% IV SOLN
INTRAVENOUS | Status: AC
  Filled 2023-04-25: qty 100

## 2023-04-25 MED ORDER — ONDANSETRON HCL 4 MG/2ML IJ SOLN
INTRAMUSCULAR | Status: DC | PRN
  Administered 2023-04-25: 4 mg via INTRAVENOUS

## 2023-04-25 MED ORDER — OXYCODONE HCL 5 MG/5ML PO SOLN: 5.0000 mg | Freq: Once | ORAL | Status: DC | PRN

## 2023-04-25 MED ORDER — SUGAMMADEX SODIUM 200 MG/2ML IV SOLN
INTRAVENOUS | Status: DC | PRN
  Administered 2023-04-25: 200 mg via INTRAVENOUS

## 2023-04-25 MED ORDER — MIDAZOLAM HCL 2 MG/2ML IJ SOLN
INTRAMUSCULAR | Status: AC
  Filled 2023-04-25: qty 2

## 2023-04-25 SURGICAL SUPPLY — 31 items
BLADE SURG 15 STRL LF DISP TIS (BLADE) ×2 IMPLANT
CHLORAPREP W/TINT 26 (MISCELLANEOUS) IMPLANT
DERMABOND ADVANCED .7 DNX12 (GAUZE/BANDAGES/DRESSINGS) IMPLANT
DRAPE LAPAROTOMY 100X77 ABD (DRAPES) ×2 IMPLANT
DRSG OPSITE POSTOP 4X10 (GAUZE/BANDAGES/DRESSINGS) IMPLANT
DRSG OPSITE POSTOP 4X8 (GAUZE/BANDAGES/DRESSINGS) IMPLANT
ELECT EZSTD 165MM 6.5IN (MISCELLANEOUS) IMPLANT
ELECT REM PT RETURN 9FT ADLT (ELECTROSURGICAL) ×2 IMPLANT
ELECTRODE EZSTD 165MM 6.5IN (MISCELLANEOUS) IMPLANT
ELECTRODE REM PT RTRN 9FT ADLT (ELECTROSURGICAL) ×2 IMPLANT
GLOVE BIO SURGEON STRL SZ 6.5 (GLOVE) ×2 IMPLANT
GLOVE BIOGEL PI IND STRL 6.5 (GLOVE) ×2 IMPLANT
GOWN STRL REUS W/ TWL LRG LVL3 (GOWN DISPOSABLE) ×2 IMPLANT
KIT TURNOVER KIT A (KITS) ×2 IMPLANT
LABEL OR SOLS (LABEL) ×2 IMPLANT
MANIFOLD NEPTUNE II (INSTRUMENTS) ×2 IMPLANT
NDL HYPO 22X1.5 SAFETY MO (MISCELLANEOUS) ×2 IMPLANT
NEEDLE HYPO 22X1.5 SAFETY MO (MISCELLANEOUS) ×2 IMPLANT
NS IRRIG 500ML POUR BTL (IV SOLUTION) ×2 IMPLANT
PACK BASIN MINOR ARMC (MISCELLANEOUS) ×2 IMPLANT
SPONGE T-LAP 18X18 ~~LOC~~+RFID (SPONGE) ×2 IMPLANT
STAPLER SKIN PROX 35W (STAPLE) IMPLANT
SUT ETHIBOND 0 (SUTURE) IMPLANT
SUT MNCRL 4-0 27 PS-2 XMFL (SUTURE) ×2 IMPLANT
SUT PDS PLUS AB 0 CT-2 (SUTURE) ×2 IMPLANT
SUT PROLENE 0 CT 2 (SUTURE) ×4 IMPLANT
SUT VIC AB 2-0 SH 27XBRD (SUTURE) ×2 IMPLANT
SUTURE MNCRL 4-0 27XMF (SUTURE) ×2 IMPLANT
SYR 10ML LL (SYRINGE) ×2 IMPLANT
TRAP FLUID SMOKE EVACUATOR (MISCELLANEOUS) ×2 IMPLANT
WATER STERILE IRR 500ML POUR (IV SOLUTION) ×2 IMPLANT

## 2023-04-25 NOTE — Anesthesia Preprocedure Evaluation (Signed)
 Anesthesia Evaluation  Patient identified by MRN, date of birth, ID band Patient awake    Reviewed: Allergy & Precautions, NPO status , Patient's Chart, lab work & pertinent test results  Airway Mallampati: II  TM Distance: >3 FB Neck ROM: Full    Dental  (+) Teeth Intact   Pulmonary neg pulmonary ROS, former smoker   Pulmonary exam normal breath sounds clear to auscultation       Cardiovascular Exercise Tolerance: Good hypertension, Pt. on medications negative cardio ROS Normal cardiovascular exam Rhythm:Regular Rate:Normal     Neuro/Psych  Headaches negative neurological ROS  negative psych ROS   GI/Hepatic negative GI ROS, Neg liver ROS,,,  Endo/Other  negative endocrine ROS    Renal/GU negative Renal ROS  negative genitourinary   Musculoskeletal   Abdominal Normal abdominal exam  (+)   Peds negative pediatric ROS (+)  Hematology negative hematology ROS (+)   Anesthesia Other Findings Past Medical History: No date: Borderline diabetes No date: Diverticulosis No date: Former cigarette smoker No date: Hyperlipidemia No date: Hypertension     Comment:  has prn med that he dose not use No date: TGA (transient global amnesia) No date: Tubular adenoma of colon No date: Umbilical hernia  Past Surgical History: 05/29/2015: COLONOSCOPY WITH PROPOFOL; N/A     Comment:  Procedure: COLONOSCOPY WITH PROPOFOL;  Surgeon: Christena Deem, MD;  Location: [facility] ENDOSCOPY;  Service:               Endoscopy;  Laterality: N/A; 2022: NASAL SINUS SURGERY     Reproductive/Obstetrics negative OB ROS                             Anesthesia Physical Anesthesia Plan  ASA: 2  Anesthesia Plan: General   Post-op Pain Management:    Induction: Intravenous  PONV Risk Score and Plan: Ondansetron, Dexamethasone, Midazolam and Treatment may vary due to age or medical condition  Airway  Management Planned: Oral ETT  Additional Equipment:   Intra-op Plan:   Post-operative Plan: Extubation in OR  Informed Consent: I have reviewed the patients History and Physical, chart, labs and discussed the procedure including the risks, benefits and alternatives for the proposed anesthesia with the patient or authorized representative who has indicated his/her understanding and acceptance.     Dental Advisory Given  Plan Discussed with: CRNA  Anesthesia Plan Comments:        Anesthesia Quick Evaluation

## 2023-04-25 NOTE — Anesthesia Postprocedure Evaluation (Signed)
 Anesthesia Post Note  Patient: Marco Carpenter  Procedure(s) Performed: REPAIR, HERNIA, UMBILICAL, ADULT (Abdomen) INSERTION OF MESH  Patient location during evaluation: PACU Anesthesia Type: General Level of consciousness: awake and alert Pain management: pain level controlled Vital Signs Assessment: post-procedure vital signs reviewed and stable Respiratory status: spontaneous breathing Cardiovascular status: stable Anesthetic complications: no   No notable events documented.   Last Vitals:  Vitals:   04/25/23 1245 04/25/23 1300  BP: (!) 143/83 (!) 142/88  Pulse: 61 63  Resp: 18 16  Temp:    SpO2: 97% 96%    Last Pain:  Vitals:   04/25/23 1300  TempSrc:   PainSc: 0-No pain                 VAN STAVEREN,Litzi Binning

## 2023-04-25 NOTE — Op Note (Signed)
 Preoperative diagnosis: Umbilical hernia.   Postoperative diagnosis: Umbilical hernia.   Procedure: Umbilical hernia repair  Anesthesia: GETA   Surgeon: Dr. Hazle Quant   Wound Classification: Clean   Indications: Patient is a 68 y.o. male developed a symptomatic incarcerated umbilical hernia. This was symptomatic and repair was indicated.    Findings: 1. A 1 cm umbilical hernia  2. No hollow viscus organ injury identified during procedure 3. Tension free repair achieved 4. Adequate hemostasis   Description of procedure: The patient was brought to the operating room and general anesthesia was induced. A time-out was completed verifying correct patient, procedure, site, positioning, and implant(s) and/or special equipment prior to beginning this procedure. Antibiotics were administered prior to making the incision. The anterior abdominal wall was prepped and draped in the standard sterile fashion. A infra umbilical incision was made. The incision was deepened to the fascia. The hernia sac was then identified and dissected free. The peritoneum of the sac was dissected from the umbilical stalk and able to be reduced easily. The fascia was carefully palpated no additional defects were identified. The hernia defect was closed with interrupted 0-Ethibond suture on midline. The umbilical stalk was fixed to fascia with 2-0 Vicryl and the skin was closed with subcuticular sutures of Monocryl 4-0. Wound covered with Dermabond.   The patient tolerated the procedure well and was brought to the postanesthesia care unit in stable condition.    Specimen: None   Complications: None   Estimated Blood Loss: 5 mL

## 2023-04-25 NOTE — Discharge Instructions (Signed)

## 2023-04-25 NOTE — Interval H&P Note (Signed)
 History and Physical Interval Note:  04/25/2023 10:44 AM  Marco Carpenter  has presented today for surgery, with the diagnosis of K42.9 umbilical hernia w/o obstruction or gangrene.  The various methods of treatment have been discussed with the patient and family. After consideration of risks, benefits and other options for treatment, the patient has consented to  Procedure(s): REPAIR, HERNIA, UMBILICAL, ADULT (N/A) as a surgical intervention.  The patient's history has been reviewed, patient examined, no change in status, stable for surgery.  I have reviewed the patient's chart and labs.  Questions were answered to the patient's satisfaction.     Carolan Shiver

## 2023-04-25 NOTE — Anesthesia Procedure Notes (Signed)
 Procedure Name: Intubation Date/Time: 04/25/2023 11:12 AM  Performed by: Elisabeth Pigeon, CRNAPre-anesthesia Checklist: Patient identified, Patient being monitored, Timeout performed, Emergency Drugs available and Suction available Patient Re-evaluated:Patient Re-evaluated prior to induction Oxygen Delivery Method: Circle system utilized Preoxygenation: Pre-oxygenation with 100% oxygen Induction Type: IV induction Ventilation: Mask ventilation without difficulty Laryngoscope Size: Mac, McGrath and 4 Grade View: Grade I Tube type: Oral Tube size: 7.0 mm Number of attempts: 1 Airway Equipment and Method: Stylet Placement Confirmation: ETT inserted through vocal cords under direct vision, positive ETCO2 and breath sounds checked- equal and bilateral Secured at: 22 cm Tube secured with: Tape Dental Injury: Teeth and Oropharynx as per pre-operative assessment

## 2023-04-25 NOTE — Transfer of Care (Signed)
 Immediate Anesthesia Transfer of Care Note  Patient: Marco Carpenter  Procedure(s) Performed: REPAIR, HERNIA, UMBILICAL, ADULT (Abdomen) INSERTION OF MESH  Patient Location: PACU  Anesthesia Type:General  Level of Consciousness: awake, drowsy, and patient cooperative  Airway & Oxygen Therapy: Patient Spontanous Breathing and Patient connected to face mask oxygen  Post-op Assessment: Report given to RN and Post -op Vital signs reviewed and stable  Post vital signs: Reviewed and stable  Last Vitals:  Vitals Value Taken Time  BP 138/81 04/25/23 1202  Temp 36.2 C 04/25/23 1202  Pulse 71 04/25/23 1205  Resp 17 04/25/23 1205  SpO2 98 % 04/25/23 1205  Vitals shown include unfiled device data.  Last Pain:  Vitals:   04/25/23 0837  TempSrc: Temporal  PainSc: 0-No pain         Complications: No notable events documented.

## 2023-04-26 ENCOUNTER — Encounter: Payer: Self-pay | Admitting: General Surgery

## 2023-05-09 DIAGNOSIS — K429 Umbilical hernia without obstruction or gangrene: Secondary | ICD-10-CM | POA: Diagnosis not present

## 2023-10-22 DIAGNOSIS — R030 Elevated blood-pressure reading, without diagnosis of hypertension: Secondary | ICD-10-CM | POA: Diagnosis not present

## 2023-10-22 DIAGNOSIS — Z6831 Body mass index (BMI) 31.0-31.9, adult: Secondary | ICD-10-CM | POA: Diagnosis not present

## 2023-10-22 DIAGNOSIS — E6609 Other obesity due to excess calories: Secondary | ICD-10-CM | POA: Diagnosis not present

## 2023-10-22 DIAGNOSIS — Z0000 Encounter for general adult medical examination without abnormal findings: Secondary | ICD-10-CM | POA: Diagnosis not present

## 2023-10-22 DIAGNOSIS — E66811 Obesity, class 1: Secondary | ICD-10-CM | POA: Diagnosis not present

## 2023-10-29 DIAGNOSIS — Z1331 Encounter for screening for depression: Secondary | ICD-10-CM | POA: Diagnosis not present

## 2023-10-29 DIAGNOSIS — R7303 Prediabetes: Secondary | ICD-10-CM | POA: Diagnosis not present

## 2023-10-29 DIAGNOSIS — Z0000 Encounter for general adult medical examination without abnormal findings: Secondary | ICD-10-CM | POA: Diagnosis not present
